# Patient Record
Sex: Female | Born: 1960 | Race: White | Hispanic: No | Marital: Married
Health system: Midwestern US, Academic
[De-identification: ages and names within clinical notes are randomized; demographics above are authoritative.]

---

## 2017-12-22 IMAGING — CR CHEST
2 series · 2 of 2 positions shown · non-contrast
Comparison: none

[chest pa x-wise]
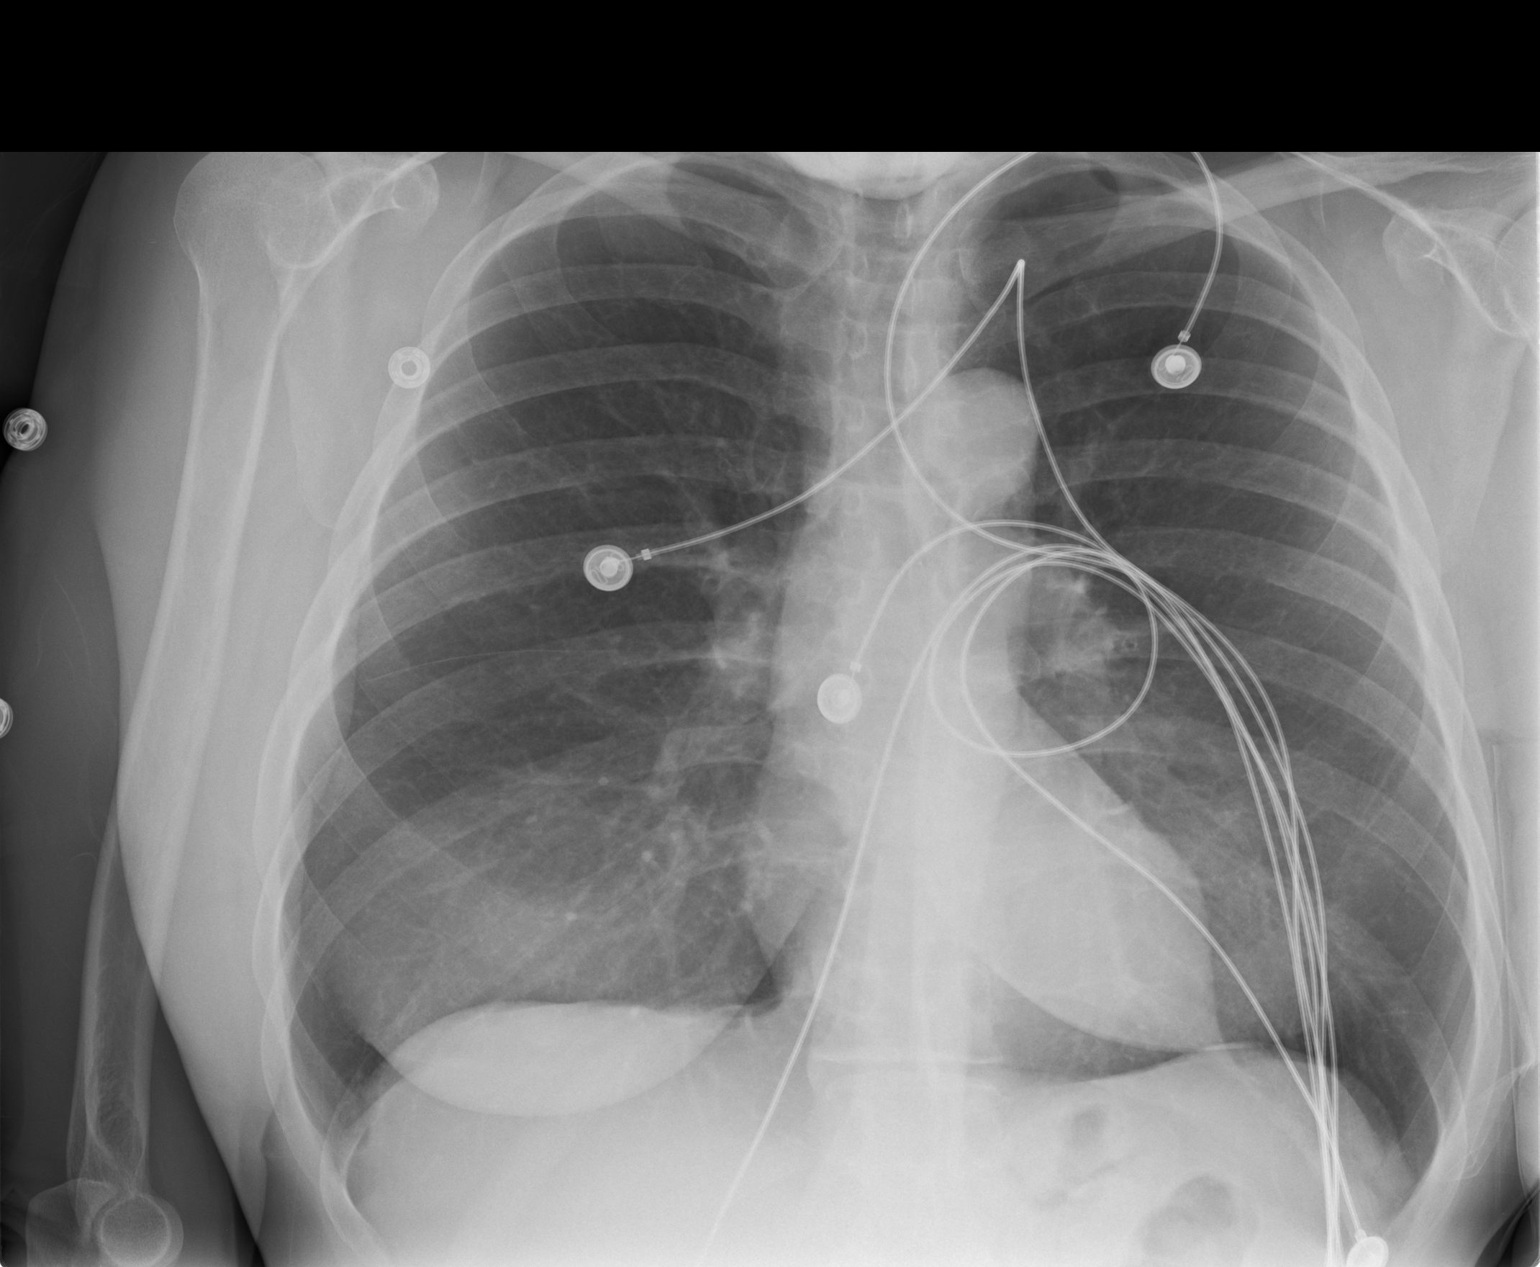

[chest lat]
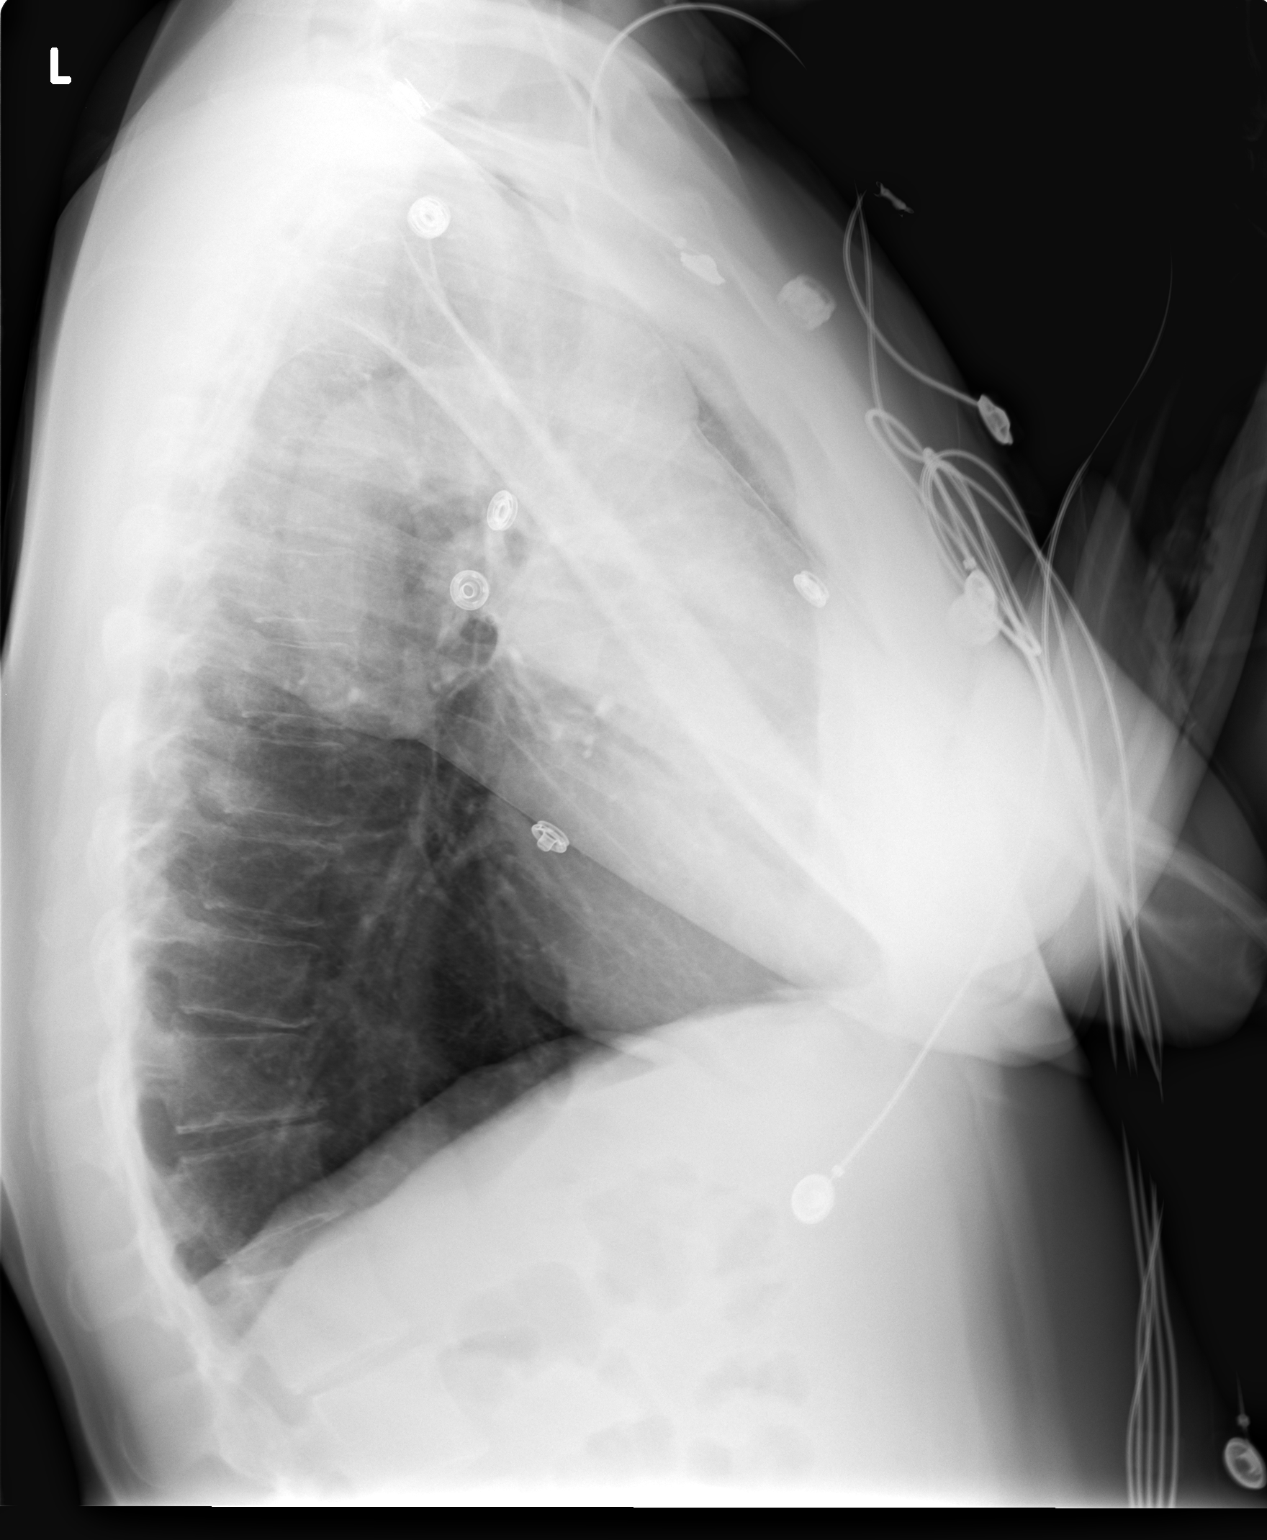

[2 of 2 positions shown; findings below may reference images not displayed]

Diagnostic Study

EXAM

Chest two-view.

INDICATION

Cough and weakness.

FINDINGS

The cardiomediastinal silhouette is within normal limits.

The lungs are hyperinflated.

There is no failure, effusion, or consolidation.

IMPRESSION

No acute lung process is identified.

Tech Notes:

WEAKNESS, ABNORMAL LABS WITH WBC 18.  NO FEVER BUT DRY COUGH.

## 2017-12-23 IMAGING — CR CHEST
2 series · 2 of 2 positions shown · non-contrast
Comparison: none

[chest pa x-wise]
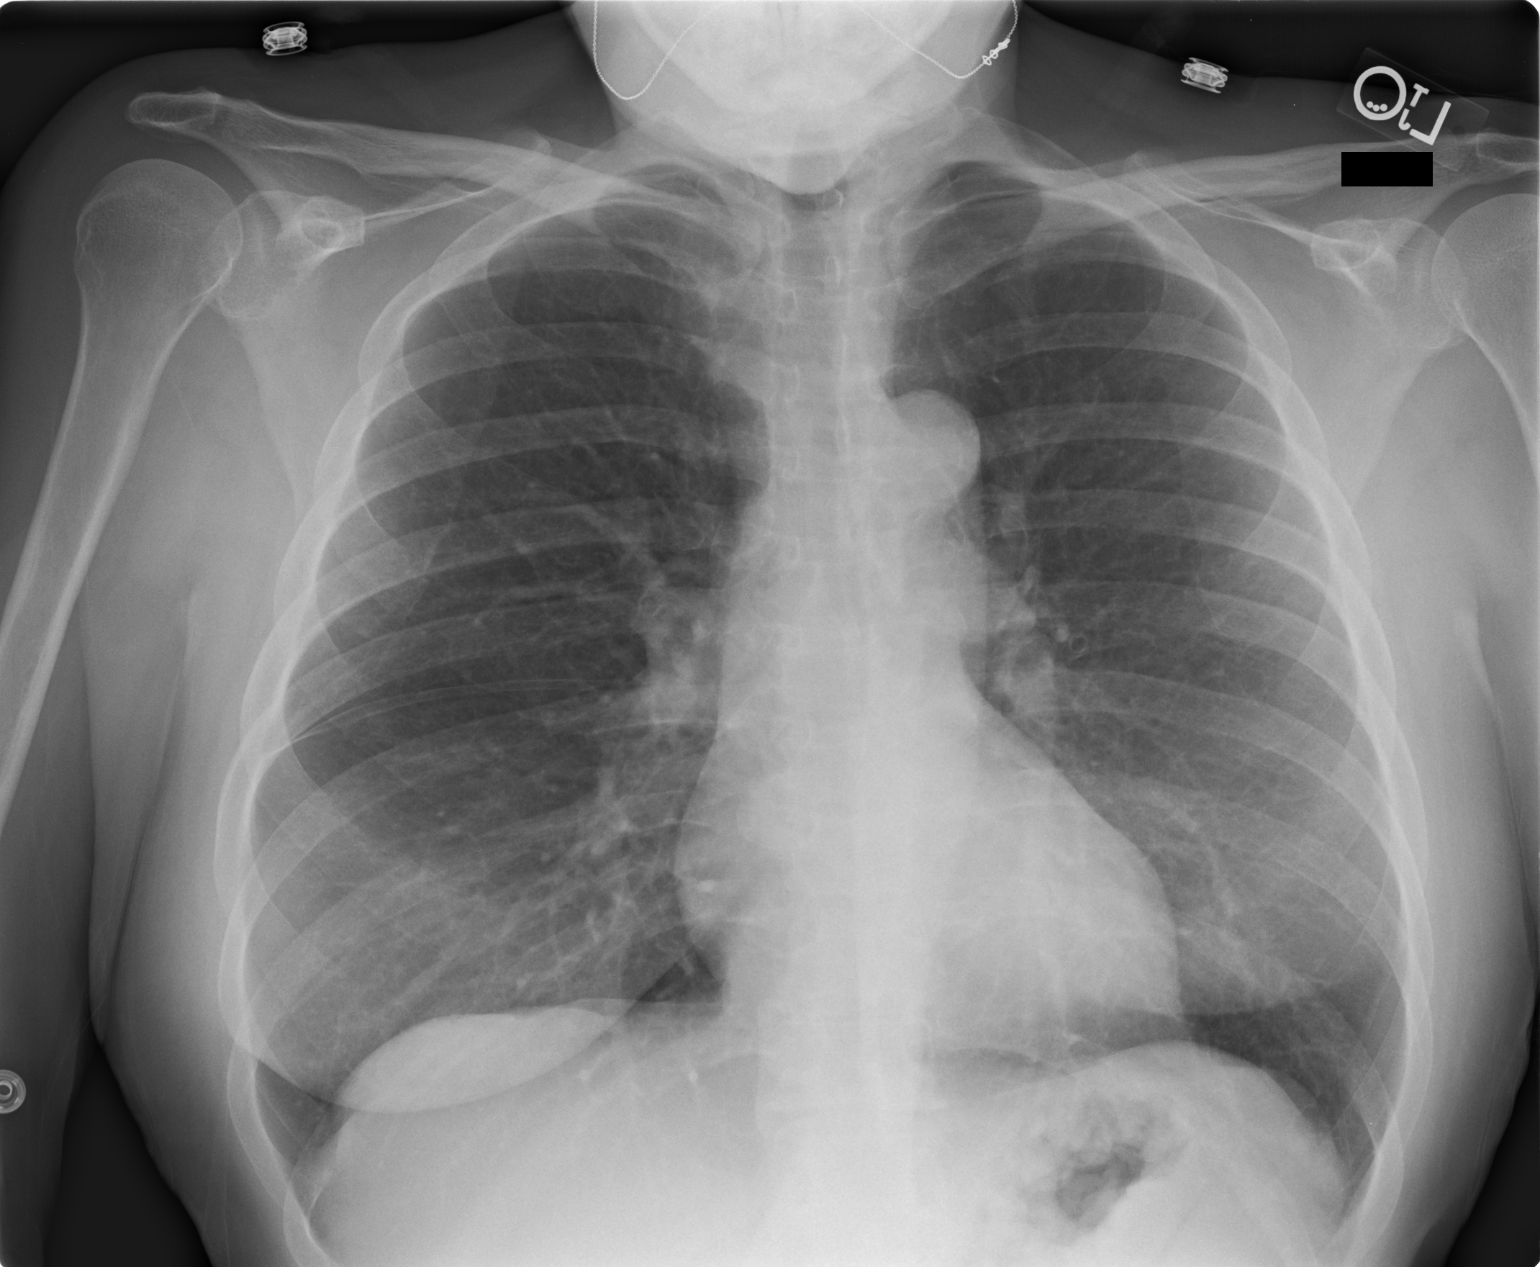

[chest lat]
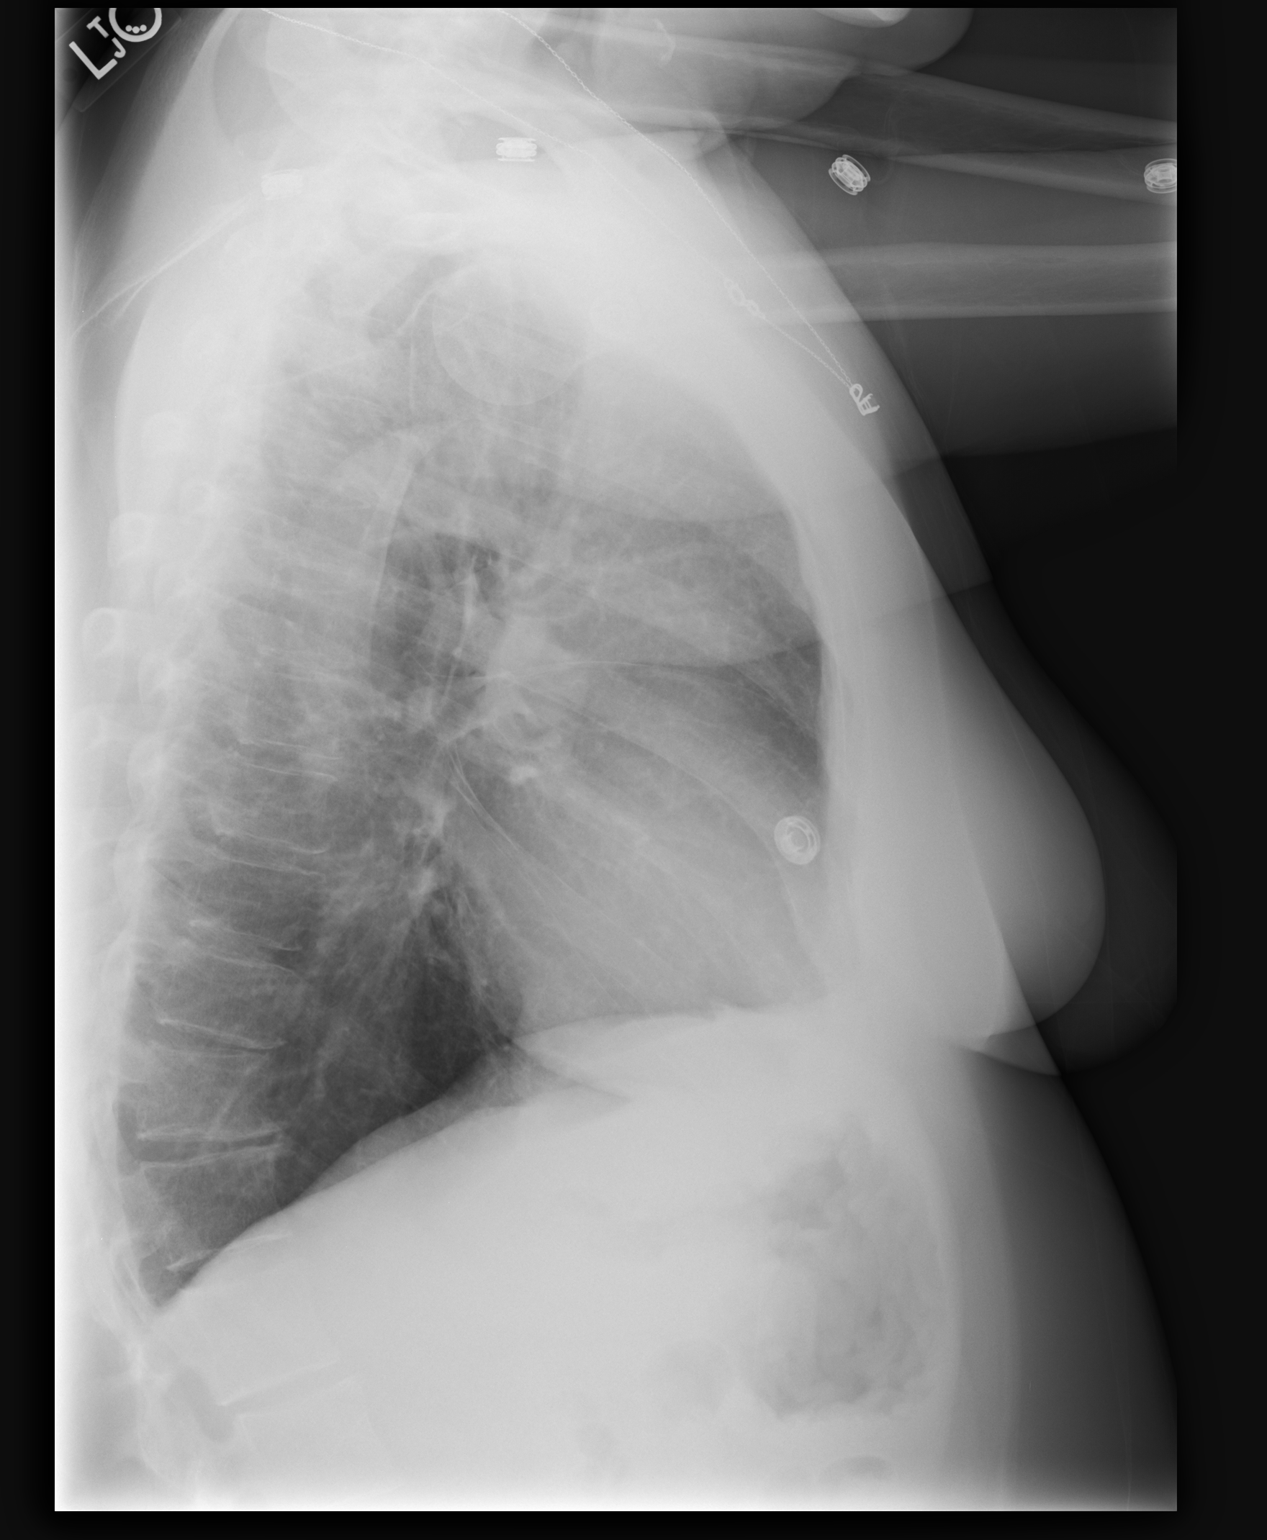

[2 of 2 positions shown; findings below may reference images not displayed]

EXAM
RADIOLOGICAL EXAMINATION, CHEST; 2 VIEWS FRONTAL AND LATERAL CPT 97767

INDICATION
cough leukocytosis
f/u cough. SOA. TJ

TECHNIQUE
2 views of the chest were acquired.

COMPARISONS
Previous examination dated 12/22/2017.

FINDINGS
The cardiac silhouette is within normal limits. The lungs are clear. The pulmonary vasculature is
normal in caliber. The bony structures are adequately mineralized. There is mild thoracic
spondylosis.

IMPRESSION
No acute cardiopulmonary process. Stable appearance of the chest.

Tech Notes:

f/u cough. SOA. TJ

## 2018-01-11 ENCOUNTER — Ambulatory Visit: Admit: 2018-01-11 | Discharge: 2018-01-12 | Payer: Commercial Managed Care - PPO

## 2018-01-11 ENCOUNTER — Encounter: Admit: 2018-01-11 | Discharge: 2018-01-11

## 2018-01-11 DIAGNOSIS — I10 Essential (primary) hypertension: Principal | ICD-10-CM

## 2018-01-11 DIAGNOSIS — R0600 Dyspnea, unspecified: ICD-10-CM

## 2018-01-11 DIAGNOSIS — F329 Major depressive disorder, single episode, unspecified: ICD-10-CM

## 2018-01-11 DIAGNOSIS — R61 Generalized hyperhidrosis: Secondary | ICD-10-CM

## 2018-01-11 DIAGNOSIS — I739 Peripheral vascular disease, unspecified: Principal | ICD-10-CM

## 2018-01-11 DIAGNOSIS — M797 Fibromyalgia: ICD-10-CM

## 2018-01-11 MED ORDER — METOPROLOL TARTRATE 25 MG PO TAB
25 mg | Freq: Two times a day (BID) | ORAL | 0 refills | Status: DC
Start: 2018-01-11 — End: 2018-01-12
  Administered 2018-01-12: 14:00:00 25 mg via ORAL

## 2018-01-11 MED ORDER — ENOXAPARIN 40 MG/0.4 ML SC SYRG
40 mg | Freq: Every day | SUBCUTANEOUS | 0 refills | Status: DC
Start: 2018-01-11 — End: 2018-01-16
  Administered 2018-01-13 – 2018-01-14 (×2): 40 mg via SUBCUTANEOUS

## 2018-01-11 MED ORDER — ACETAMINOPHEN 325 MG PO TAB
650 mg | ORAL | 0 refills | Status: DC | PRN
Start: 2018-01-11 — End: 2018-01-16
  Administered 2018-01-12 – 2018-01-15 (×4): 650 mg via ORAL

## 2018-01-11 MED ORDER — PANTOPRAZOLE 40 MG PO TBEC
40 mg | Freq: Every day | ORAL | 0 refills | Status: DC
Start: 2018-01-11 — End: 2018-01-16
  Administered 2018-01-13 – 2018-01-16 (×4): 40 mg via ORAL

## 2018-01-11 MED ORDER — ONDANSETRON HCL (PF) 4 MG/2 ML IJ SOLN
4 mg | INTRAVENOUS | 0 refills | Status: DC | PRN
Start: 2018-01-11 — End: 2018-01-16

## 2018-01-11 MED ORDER — GABAPENTIN 300 MG PO CAP
600 mg | Freq: Two times a day (BID) | ORAL | 0 refills | Status: DC
Start: 2018-01-11 — End: 2018-01-16
  Administered 2018-01-12 – 2018-01-16 (×10): 600 mg via ORAL

## 2018-01-11 MED ORDER — BUPROPION XL 150 MG PO TB24
300 mg | Freq: Every day | ORAL | 0 refills | Status: DC
Start: 2018-01-11 — End: 2018-01-16
  Administered 2018-01-13 – 2018-01-16 (×4): 300 mg via ORAL

## 2018-01-12 ENCOUNTER — Inpatient Hospital Stay
Admit: 2018-01-12 | Discharge: 2018-01-16 | Disposition: A | Discharge status: Home / Self Care | Payer: MEDICARE | Source: Ambulatory Visit

## 2018-01-12 ENCOUNTER — Inpatient Hospital Stay: Admit: 2018-01-12 | Discharge: 2018-01-12

## 2018-01-12 ENCOUNTER — Encounter: Admit: 2018-01-12 | Discharge: 2018-01-12

## 2018-01-12 DIAGNOSIS — N12 Tubulo-interstitial nephritis, not specified as acute or chronic: ICD-10-CM

## 2018-01-12 DIAGNOSIS — R0600 Dyspnea, unspecified: ICD-10-CM

## 2018-01-12 DIAGNOSIS — I739 Peripheral vascular disease, unspecified: Principal | ICD-10-CM

## 2018-01-12 DIAGNOSIS — F329 Major depressive disorder, single episode, unspecified: ICD-10-CM

## 2018-01-12 DIAGNOSIS — M797 Fibromyalgia: ICD-10-CM

## 2018-01-12 LAB — URINALYSIS DIPSTICK: Lab: NEGATIVE mL/min (ref 1.0–4.8)

## 2018-01-12 LAB — PHOSPHORUS: Lab: 3.5 mg/dL (ref 2.0–4.5)

## 2018-01-12 LAB — COMPREHENSIVE METABOLIC PANEL
Lab: 138 MMOL/L — ABNORMAL LOW (ref 137–147)
Lab: 126 mg/dL — ABNORMAL HIGH (ref >60)
Lab: 142 MMOL/L — ABNORMAL HIGH (ref 137–147)
Lab: 4.2 MMOL/L — ABNORMAL LOW (ref 3.5–5.1)

## 2018-01-12 LAB — C REACTIVE PROTEIN (CRP): Lab: 14 mg/dL — ABNORMAL HIGH (ref <1.0)

## 2018-01-12 LAB — TSH WITH FREE T4 REFLEX: Lab: 1.5 uU/mL (ref 0.35–5.00)

## 2018-01-12 LAB — LACTIC ACID(LACTATE): Lab: 0.6 MMOL/L — ABNORMAL HIGH (ref 0.5–2.0)

## 2018-01-12 LAB — SED RATE: Lab: 81 mm/h — ABNORMAL HIGH (ref 0–30)

## 2018-01-12 LAB — CBC AND DIFF
Lab: 12 10*3/uL — ABNORMAL HIGH (ref 4.5–11.0)
Lab: 6.5 K/UL — ABNORMAL LOW (ref >60)

## 2018-01-12 LAB — MAGNESIUM: Lab: 2 mg/dL (ref 1.6–2.6)

## 2018-01-12 LAB — URINALYSIS, MICROSCOPIC

## 2018-01-12 MED ORDER — MELATONIN 3 MG PO TAB
3 mg | Freq: Every evening | ORAL | 0 refills | Status: DC
Start: 2018-01-12 — End: 2018-01-16
  Administered 2018-01-12 – 2018-01-16 (×5): 3 mg via ORAL

## 2018-01-12 MED ORDER — ASCORBIC ACID (VITAMIN C) 500 MG PO TAB
250 mg | Freq: Every day | ORAL | 0 refills | Status: DC
Start: 2018-01-12 — End: 2018-01-16
  Administered 2018-01-13 – 2018-01-16 (×4): 250 mg via ORAL

## 2018-01-12 MED ORDER — METOPROLOL TARTRATE 25 MG PO TAB
25 mg | Freq: Two times a day (BID) | ORAL | 0 refills | Status: CP
Start: 2018-01-12 — End: ?
  Administered 2018-01-13: 02:00:00 25 mg via ORAL

## 2018-01-12 MED ORDER — METOPROLOL SUCCINATE 25 MG PO TB24
25 mg | Freq: Every morning | ORAL | 0 refills | Status: DC
Start: 2018-01-12 — End: 2018-01-12

## 2018-01-12 MED ORDER — CYANOCOBALAMIN (VITAMIN B-12) 100 MCG PO TAB
100 ug | Freq: Every day | ORAL | 0 refills | Status: DC
Start: 2018-01-12 — End: 2018-01-16
  Administered 2018-01-13 – 2018-01-16 (×4): 100 ug via ORAL

## 2018-01-12 MED ORDER — METOPROLOL SUCCINATE 25 MG PO TB24
25 mg | Freq: Every morning | ORAL | 0 refills | Status: DC
Start: 2018-01-12 — End: 2018-01-16
  Administered 2018-01-13 – 2018-01-16 (×4): 25 mg via ORAL

## 2018-01-12 MED ORDER — PERFLUTREN LIPID MICROSPHERES 1.1 MG/ML IV SUSP
1-20 mL | Freq: Once | INTRAVENOUS | 0 refills | Status: CP | PRN
Start: 2018-01-12 — End: ?
  Administered 2018-01-12: 22:00:00 2.5 mL via INTRAVENOUS

## 2018-01-12 MED ORDER — TRAMADOL 50 MG PO TAB
50 mg | ORAL | 0 refills | Status: DC | PRN
Start: 2018-01-12 — End: 2018-01-16
  Administered 2018-01-12 – 2018-01-14 (×4): 50 mg via ORAL

## 2018-01-12 MED ORDER — LORATADINE 10 MG PO TAB
10 mg | Freq: Every day | ORAL | 0 refills | Status: DC | PRN
Start: 2018-01-12 — End: 2018-01-16

## 2018-01-13 ENCOUNTER — Encounter: Admit: 2018-01-13 | Discharge: 2018-01-13

## 2018-01-13 ENCOUNTER — Inpatient Hospital Stay: Admit: 2018-01-13 | Discharge: 2018-01-13

## 2018-01-13 DIAGNOSIS — N12 Tubulo-interstitial nephritis, not specified as acute or chronic: ICD-10-CM

## 2018-01-13 LAB — CBC AND DIFF: Lab: 9.8 K/UL — ABNORMAL LOW (ref 4.5–11.0)

## 2018-01-13 LAB — COMPREHENSIVE METABOLIC PANEL: Lab: 134 MMOL/L — ABNORMAL LOW (ref 137–147)

## 2018-01-13 MED ORDER — SODIUM CHLORIDE 0.9 % IJ SOLN
50 mL | Freq: Once | INTRAVENOUS | 0 refills | Status: CP
Start: 2018-01-13 — End: ?
  Administered 2018-01-14: 02:00:00 50 mL via INTRAVENOUS

## 2018-01-13 MED ORDER — IOHEXOL 350 MG IODINE/ML IV SOLN
85 mL | Freq: Once | INTRAVENOUS | 0 refills | Status: CP
Start: 2018-01-13 — End: ?
  Administered 2018-01-14: 02:00:00 85 mL via INTRAVENOUS

## 2018-01-13 MED ORDER — LORAZEPAM 0.5 MG PO TAB
.5 mg | Freq: Once | ORAL | 0 refills | Status: AC
Start: 2018-01-13 — End: ?

## 2018-01-13 MED ORDER — ALBUTEROL SULFATE 2.5 MG /3 ML (0.083 %) IN NEBU
2.5 mg | RESPIRATORY_TRACT | 0 refills | Status: DC | PRN
Start: 2018-01-13 — End: 2018-01-16

## 2018-01-13 MED ADMIN — ALBUTEROL SULFATE 2.5 MG /3 ML (0.083 %) IN NEBU [250]: 2.5 mg | RESPIRATORY_TRACT | @ 14:00:00 | Stop: 2018-01-13 | NDC 00487950101

## 2018-01-14 LAB — CULTURE-URINE W/SENSITIVITY
Lab: >10
Lab: >10 — AB

## 2018-01-14 LAB — COMPREHENSIVE METABOLIC PANEL: Lab: 136 MMOL/L — ABNORMAL LOW (ref 137–147)

## 2018-01-14 LAB — CBC AND DIFF: Lab: 7.7 K/UL — ABNORMAL LOW (ref 4.5–11.0)

## 2018-01-14 MED ORDER — LEVOFLOXACIN IN D5W 750 MG/150 ML IV PGBK
750 mg | INTRAVENOUS | 0 refills | Status: DC
Start: 2018-01-14 — End: 2018-01-16
  Administered 2018-01-14 – 2018-01-15 (×2): 750 mg via INTRAVENOUS

## 2018-01-15 LAB — COMPREHENSIVE METABOLIC PANEL: Lab: 138 MMOL/L — ABNORMAL LOW (ref >60)

## 2018-01-15 LAB — CBC AND DIFF: Lab: 4.8 K/UL — ABNORMAL LOW (ref 4.5–11.0)

## 2018-01-15 MED ORDER — POLYETHYLENE GLYCOL 3350 17 GRAM PO PWPK
1 | Freq: Every day | ORAL | 0 refills | Status: DC
Start: 2018-01-15 — End: 2018-01-16
  Administered 2018-01-15 – 2018-01-16 (×2): 17 g via ORAL

## 2018-01-15 MED ORDER — LACTOBACILLUS RHAMNOSUS GG 15 BILLION CELL PO CPSP
1 | Freq: Two times a day (BID) | ORAL | 0 refills | Status: DC
Start: 2018-01-15 — End: 2018-01-16
  Administered 2018-01-15 – 2018-01-16 (×2): 1 via ORAL

## 2018-01-16 ENCOUNTER — Encounter: Admit: 2018-01-16 | Discharge: 2018-01-16

## 2018-01-16 ENCOUNTER — Inpatient Hospital Stay: Admit: 2018-01-11 | Discharge: 2018-01-11

## 2018-01-16 DIAGNOSIS — K219 Gastro-esophageal reflux disease without esophagitis: ICD-10-CM

## 2018-01-16 DIAGNOSIS — B9620 Unspecified Escherichia coli [E. coli] as the cause of diseases classified elsewhere: ICD-10-CM

## 2018-01-16 DIAGNOSIS — J449 Chronic obstructive pulmonary disease, unspecified: ICD-10-CM

## 2018-01-16 DIAGNOSIS — I10 Essential (primary) hypertension: ICD-10-CM

## 2018-01-16 DIAGNOSIS — M797 Fibromyalgia: ICD-10-CM

## 2018-01-16 DIAGNOSIS — R61 Generalized hyperhidrosis: ICD-10-CM

## 2018-01-16 DIAGNOSIS — F329 Major depressive disorder, single episode, unspecified: ICD-10-CM

## 2018-01-16 DIAGNOSIS — N10 Acute tubulo-interstitial nephritis: Principal | ICD-10-CM

## 2018-01-16 MED ORDER — LEVOFLOXACIN 750 MG PO TAB
750 mg | ORAL_TABLET | Freq: Every day | ORAL | 0 refills | 7.00000 days | Status: AC
Start: 2018-01-16 — End: 2018-01-16
  Filled 2018-01-16 (×2): qty 5, 5d supply, fill #1

## 2018-01-16 MED ORDER — LEVOFLOXACIN 750 MG PO TAB
750 mg | ORAL_TABLET | Freq: Every day | ORAL | 0 refills | 7.00000 days | Status: AC
Start: 2018-01-16 — End: 2018-02-05

## 2018-01-16 MED ORDER — POLYETHYLENE GLYCOL 3350 17 GRAM PO PWPK
17 g | Freq: Two times a day (BID) | ORAL | 0 refills | 18.00000 days | Status: AC
Start: 2018-01-16 — End: ?

## 2018-01-16 MED ORDER — SENNOSIDES-DOCUSATE SODIUM 8.6-50 MG PO TAB
1 | Freq: Two times a day (BID) | ORAL | 0 refills | Status: AC | PRN
Start: 2018-01-16 — End: ?

## 2018-01-16 MED ORDER — LACTOBACILLUS RHAMNOSUS GG 15 BILLION CELL PO CPSP
1 | ORAL_CAPSULE | Freq: Two times a day (BID) | ORAL | 3 refills | Status: AC
Start: 2018-01-16 — End: ?

## 2018-01-18 LAB — CULTURE-BLOOD W/SENSITIVITY

## 2018-01-31 ENCOUNTER — Encounter: Admit: 2018-01-31 | Discharge: 2018-01-31

## 2018-02-02 ENCOUNTER — Encounter: Admit: 2018-02-02 | Discharge: 2018-02-02

## 2018-02-02 ENCOUNTER — Inpatient Hospital Stay
Admit: 2018-02-02 | Discharge: 2018-02-05 | Disposition: A | Discharge status: Home / Self Care | Payer: MEDICARE | Source: Other Acute Inpatient Hospital

## 2018-02-02 LAB — URINALYSIS, MICROSCOPIC

## 2018-02-02 LAB — CBC AND DIFF: Lab: 15 10*3/uL — ABNORMAL HIGH (ref 4.5–11.0)

## 2018-02-02 LAB — URINALYSIS DIPSTICK
Lab: NEGATIVE 10*3/uL (ref 0–0.45)
Lab: NEGATIVE mL/min (ref 0–0.80)
Lab: NEGATIVE mL/min (ref 1.0–4.8)

## 2018-02-02 MED ORDER — CYANOCOBALAMIN (VITAMIN B-12) 100 MCG PO TAB
100 ug | Freq: Every day | ORAL | 0 refills | Status: DC
Start: 2018-02-02 — End: 2018-02-05
  Administered 2018-02-03 – 2018-02-05 (×3): 100 ug via ORAL

## 2018-02-02 MED ORDER — GABAPENTIN 300 MG PO CAP
600 mg | Freq: Two times a day (BID) | ORAL | 0 refills | Status: DC
Start: 2018-02-02 — End: 2018-02-05
  Administered 2018-02-03 – 2018-02-05 (×6): 600 mg via ORAL

## 2018-02-02 MED ORDER — LACTOBACILLUS RHAMNOSUS GG 15 BILLION CELL PO CPSP
1 | Freq: Two times a day (BID) | ORAL | 0 refills | Status: DC
Start: 2018-02-02 — End: 2018-02-05
  Administered 2018-02-02 – 2018-02-05 (×7): 1 via ORAL

## 2018-02-02 MED ORDER — POLYETHYLENE GLYCOL 3350 17 GRAM PO PWPK
17 g | Freq: Two times a day (BID) | ORAL | 0 refills | Status: DC
Start: 2018-02-02 — End: 2018-02-05
  Administered 2018-02-03 – 2018-02-05 (×5): 17 g via ORAL

## 2018-02-02 MED ORDER — DICLOFENAC SODIUM 1 % TP GEL
4 g | TOPICAL | 0 refills | Status: DC | PRN
Start: 2018-02-02 — End: 2018-02-05

## 2018-02-02 MED ORDER — TRAMADOL 50 MG PO TAB
50 mg | ORAL | 0 refills | Status: DC | PRN
Start: 2018-02-02 — End: 2018-02-05
  Administered 2018-02-02 – 2018-02-03 (×3): 50 mg via ORAL

## 2018-02-02 MED ORDER — ASCORBIC ACID (VITAMIN C) 500 MG PO TAB
250 mg | Freq: Every day | ORAL | 0 refills | Status: DC
Start: 2018-02-02 — End: 2018-02-05
  Administered 2018-02-03 – 2018-02-05 (×3): 250 mg via ORAL

## 2018-02-02 MED ORDER — SENNOSIDES-DOCUSATE SODIUM 8.6-50 MG PO TAB
1 | Freq: Two times a day (BID) | ORAL | 0 refills | Status: DC | PRN
Start: 2018-02-02 — End: 2018-02-05

## 2018-02-02 MED ORDER — BUPROPION XL 300 MG PO TB24
300 mg | Freq: Every morning | ORAL | 0 refills | Status: DC
Start: 2018-02-02 — End: 2018-02-05
  Administered 2018-02-03 – 2018-02-05 (×3): 300 mg via ORAL

## 2018-02-02 MED ORDER — CEFTRIAXONE INJ 1GM IVP
1 g | INTRAVENOUS | 0 refills | Status: DC
Start: 2018-02-02 — End: 2018-02-03
  Administered 2018-02-02: 1 g via INTRAVENOUS

## 2018-02-02 MED ORDER — ENOXAPARIN 40 MG/0.4 ML SC SYRG
40 mg | Freq: Every day | SUBCUTANEOUS | 0 refills | Status: DC
Start: 2018-02-02 — End: 2018-02-05
  Administered 2018-02-03 – 2018-02-04 (×2): 40 mg via SUBCUTANEOUS

## 2018-02-02 MED ORDER — PANTOPRAZOLE 20 MG PO TBEC
20 mg | Freq: Every day | ORAL | 0 refills | Status: DC
Start: 2018-02-02 — End: 2018-02-05
  Administered 2018-02-03 – 2018-02-05 (×3): 20 mg via ORAL

## 2018-02-02 MED ORDER — METOPROLOL SUCCINATE 25 MG PO TB24
25 mg | Freq: Every morning | ORAL | 0 refills | Status: DC
Start: 2018-02-02 — End: 2018-02-05
  Administered 2018-02-03 – 2018-02-05 (×3): 25 mg via ORAL

## 2018-02-02 MED ORDER — ACETAMINOPHEN 325 MG PO TAB
650 mg | Freq: Once | ORAL | 0 refills | Status: CP
Start: 2018-02-02 — End: ?
  Administered 2018-02-03: 02:00:00 650 mg via ORAL

## 2018-02-03 ENCOUNTER — Encounter: Admit: 2018-02-03 | Discharge: 2018-02-03

## 2018-02-03 ENCOUNTER — Inpatient Hospital Stay: Admit: 2018-02-03 | Discharge: 2018-02-03

## 2018-02-03 LAB — COMPREHENSIVE METABOLIC PANEL
Lab: 133 MMOL/L — ABNORMAL LOW (ref 137–147)
Lab: 139 MMOL/L — ABNORMAL LOW (ref >60)
Lab: 4 MMOL/L (ref >60)

## 2018-02-03 LAB — CBC AND DIFF
Lab: 3.6 M/UL — ABNORMAL LOW (ref 4.0–5.0)
Lab: 9 K/UL — ABNORMAL LOW (ref 4.5–11.0)

## 2018-02-03 LAB — MAGNESIUM
Lab: 1.9 mg/dL — ABNORMAL LOW (ref 1.6–2.6)
Lab: 2.1 mg/dL (ref 1.6–2.6)

## 2018-02-03 MED ORDER — IOHEXOL 350 MG IODINE/ML IV SOLN
80 mL | Freq: Once | INTRAVENOUS | 0 refills | Status: CP
Start: 2018-02-03 — End: ?
  Administered 2018-02-03: 16:00:00 80 mL via INTRAVENOUS

## 2018-02-03 MED ORDER — SODIUM CHLORIDE 0.9 % IJ SOLN
50 mL | Freq: Once | INTRAVENOUS | 0 refills | Status: CP
Start: 2018-02-03 — End: ?
  Administered 2018-02-03: 16:00:00 50 mL via INTRAVENOUS

## 2018-02-03 MED ORDER — HYDROCORTISONE 1 % TP CREA
Freq: Two times a day (BID) | TOPICAL | 0 refills | Status: DC
Start: 2018-02-03 — End: 2018-02-05
  Administered 2018-02-04 – 2018-02-05 (×2): via TOPICAL

## 2018-02-03 MED ORDER — CEFTRIAXONE INJ 1GM IVP
1 g | INTRAVENOUS | 0 refills | Status: DC
Start: 2018-02-03 — End: 2018-02-04
  Administered 2018-02-03: 23:00:00 1 g via INTRAVENOUS

## 2018-02-03 MED ORDER — DIPHENHYDRAMINE HCL 12.5 MG/5 ML PO ELIX
25 mg | Freq: Once | ORAL | 0 refills | Status: CP
Start: 2018-02-03 — End: ?
  Administered 2018-02-04: 04:00:00 25 mg via ORAL

## 2018-02-03 MED ADMIN — SODIUM CHLORIDE 0.9 % IJ SOLN [7319]: 10 mL | INTRAVENOUS | @ 23:00:00 | Stop: 2018-02-03 | NDC 00409488803

## 2018-02-04 LAB — COMPREHENSIVE METABOLIC PANEL
Lab: 138 MMOL/L (ref 137–147)
Lab: 3.9 MMOL/L — ABNORMAL LOW (ref >60)

## 2018-02-04 LAB — MAGNESIUM: Lab: 2.1 mg/dL — ABNORMAL HIGH (ref 1.6–2.6)

## 2018-02-04 LAB — CULTURE-URINE W/SENSITIVITY

## 2018-02-04 LAB — CBC AND DIFF
Lab: 3.9 M/UL — ABNORMAL LOW (ref 4.0–5.0)
Lab: 6.8 K/UL — ABNORMAL LOW (ref 4.5–11.0)

## 2018-02-04 MED ORDER — LEVOFLOXACIN IN D5W 750 MG/150 ML IV PGBK
750 mg | INTRAVENOUS | 0 refills | Status: DC
Start: 2018-02-04 — End: 2018-02-05
  Administered 2018-02-04: 16:00:00 750 mg via INTRAVENOUS

## 2018-02-04 MED ORDER — DIPHENHYDRAMINE HCL 25 MG PO CAP
25 mg | Freq: Two times a day (BID) | ORAL | 0 refills | Status: DC | PRN
Start: 2018-02-04 — End: 2018-02-05
  Administered 2018-02-04 – 2018-02-05 (×2): 25 mg via ORAL

## 2018-02-04 MED ORDER — LEVOFLOXACIN IN D5W 500 MG/100 ML IV PGBK
500 mg | INTRAVENOUS | 0 refills | Status: DC
Start: 2018-02-04 — End: 2018-02-04

## 2018-02-05 ENCOUNTER — Encounter: Admit: 2018-02-05 | Discharge: 2018-02-05

## 2018-02-05 DIAGNOSIS — R21 Rash and other nonspecific skin eruption: ICD-10-CM

## 2018-02-05 DIAGNOSIS — N12 Tubulo-interstitial nephritis, not specified as acute or chronic: ICD-10-CM

## 2018-02-05 DIAGNOSIS — T361X5A Adverse effect of cephalosporins and other beta-lactam antibiotics, initial encounter: ICD-10-CM

## 2018-02-05 DIAGNOSIS — I739 Peripheral vascular disease, unspecified: ICD-10-CM

## 2018-02-05 DIAGNOSIS — Z87891 Personal history of nicotine dependence: ICD-10-CM

## 2018-02-05 DIAGNOSIS — N3000 Acute cystitis without hematuria: Principal | ICD-10-CM

## 2018-02-05 DIAGNOSIS — I10 Essential (primary) hypertension: ICD-10-CM

## 2018-02-05 DIAGNOSIS — F329 Major depressive disorder, single episode, unspecified: ICD-10-CM

## 2018-02-05 DIAGNOSIS — M797 Fibromyalgia: ICD-10-CM

## 2018-02-05 LAB — MAGNESIUM: Lab: 2.2 mg/dL — ABNORMAL HIGH (ref 1.6–2.6)

## 2018-02-05 LAB — CBC AND DIFF: Lab: 7.5 K/UL — ABNORMAL LOW (ref 4.5–11.0)

## 2018-02-05 LAB — COMPREHENSIVE METABOLIC PANEL: Lab: 138 MMOL/L — ABNORMAL LOW (ref >60)

## 2018-02-05 MED ORDER — LEVOFLOXACIN 750 MG PO TAB
750 mg | ORAL_TABLET | ORAL | 0 refills | 7.00000 days | Status: AC
Start: 2018-02-05 — End: ?
  Filled 2018-02-05 (×2): qty 9, 9d supply, fill #1

## 2018-02-05 MED ORDER — LEVOFLOXACIN 750 MG PO TAB
750 mg | ORAL | 0 refills | Status: DC
Start: 2018-02-05 — End: 2018-02-05
  Administered 2018-02-05: 16:00:00 750 mg via ORAL

## 2018-02-05 MED ORDER — LEVOFLOXACIN 750 MG PO TAB
750 mg | ORAL_TABLET | ORAL | 0 refills | Status: CN
Start: 2018-02-05 — End: ?

## 2018-02-08 LAB — CULTURE-BLOOD W/SENSITIVITY

## 2019-04-12 IMAGING — CR CHEST
2 series · 2 of 2 positions shown · non-contrast
Comparison: none

[chest pa]
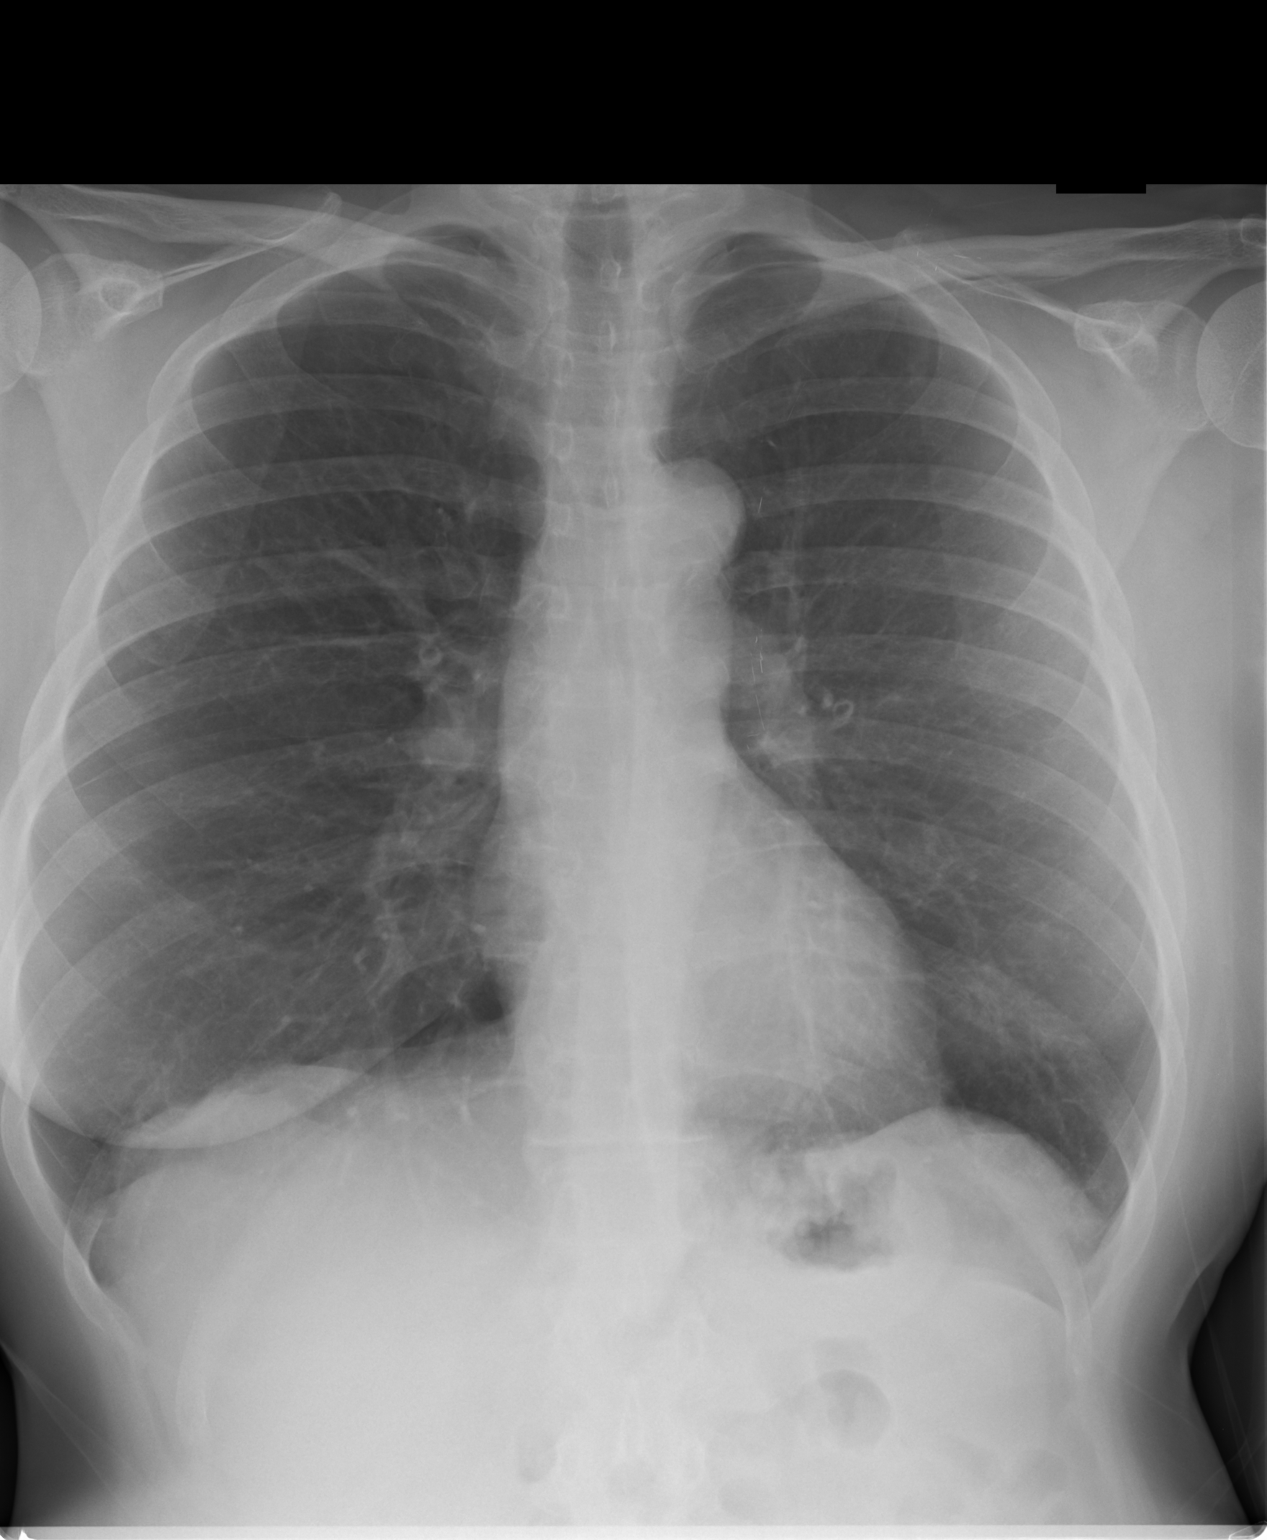

[chest lat]
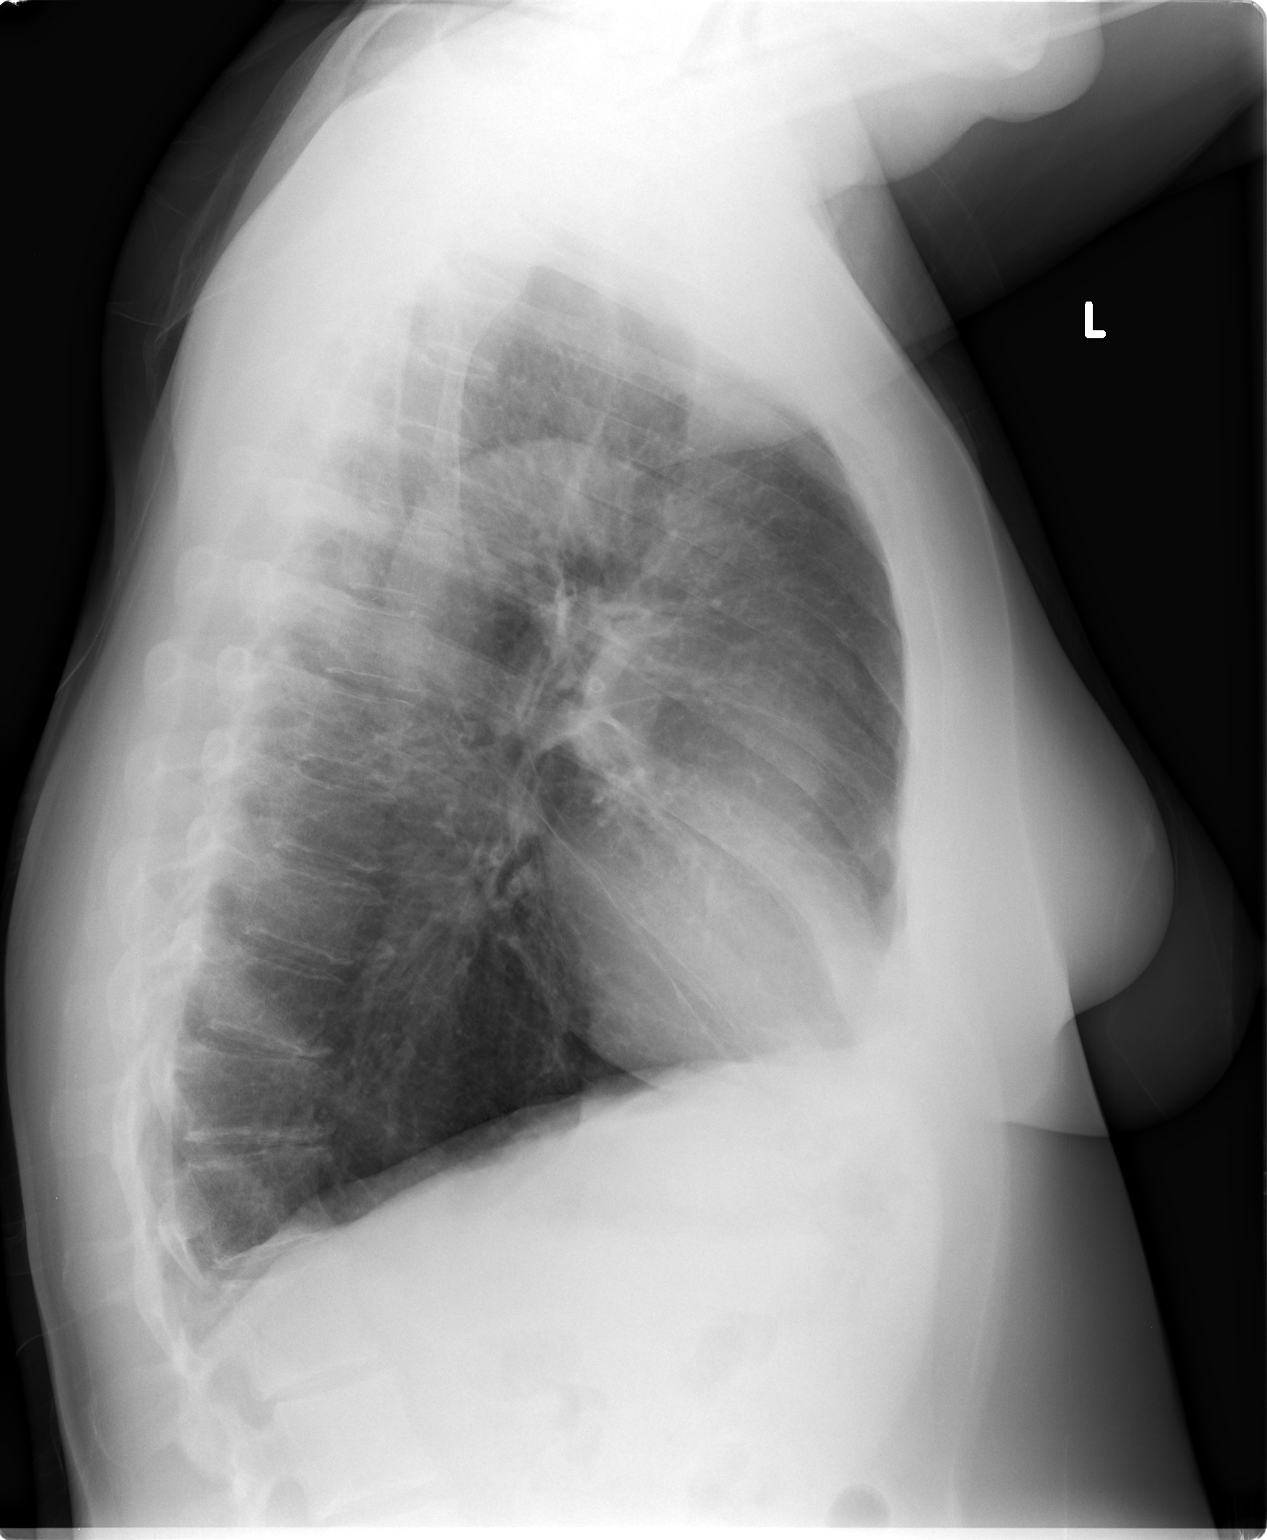

[2 of 2 positions shown; findings below may reference images not displayed]

EXAM

XR chest 2V

INDICATION

COPD with dyspnea and SOB
cough, soa and sweating. h/o copd. me/tm

TECHNIQUE

XR chest 2V

COMPARISONS

December 23, 2017

FINDINGS

High lung volumes. No focal consolidation, pleural effusion, or pneumothorax. The heart size is
normal. Pulmonary vasculature is unremarkable. No acute osseous findings. Degenerative changes of
the spine.

IMPRESSION

No acute cardiopulmonary findings.

Tech Notes:

cough, soa and sweating. h/o copd. me/tm

## 2019-04-23 IMAGING — CR CHEST
2 series · 2 of 2 positions shown · non-contrast
Comparison: none

[chest pa]
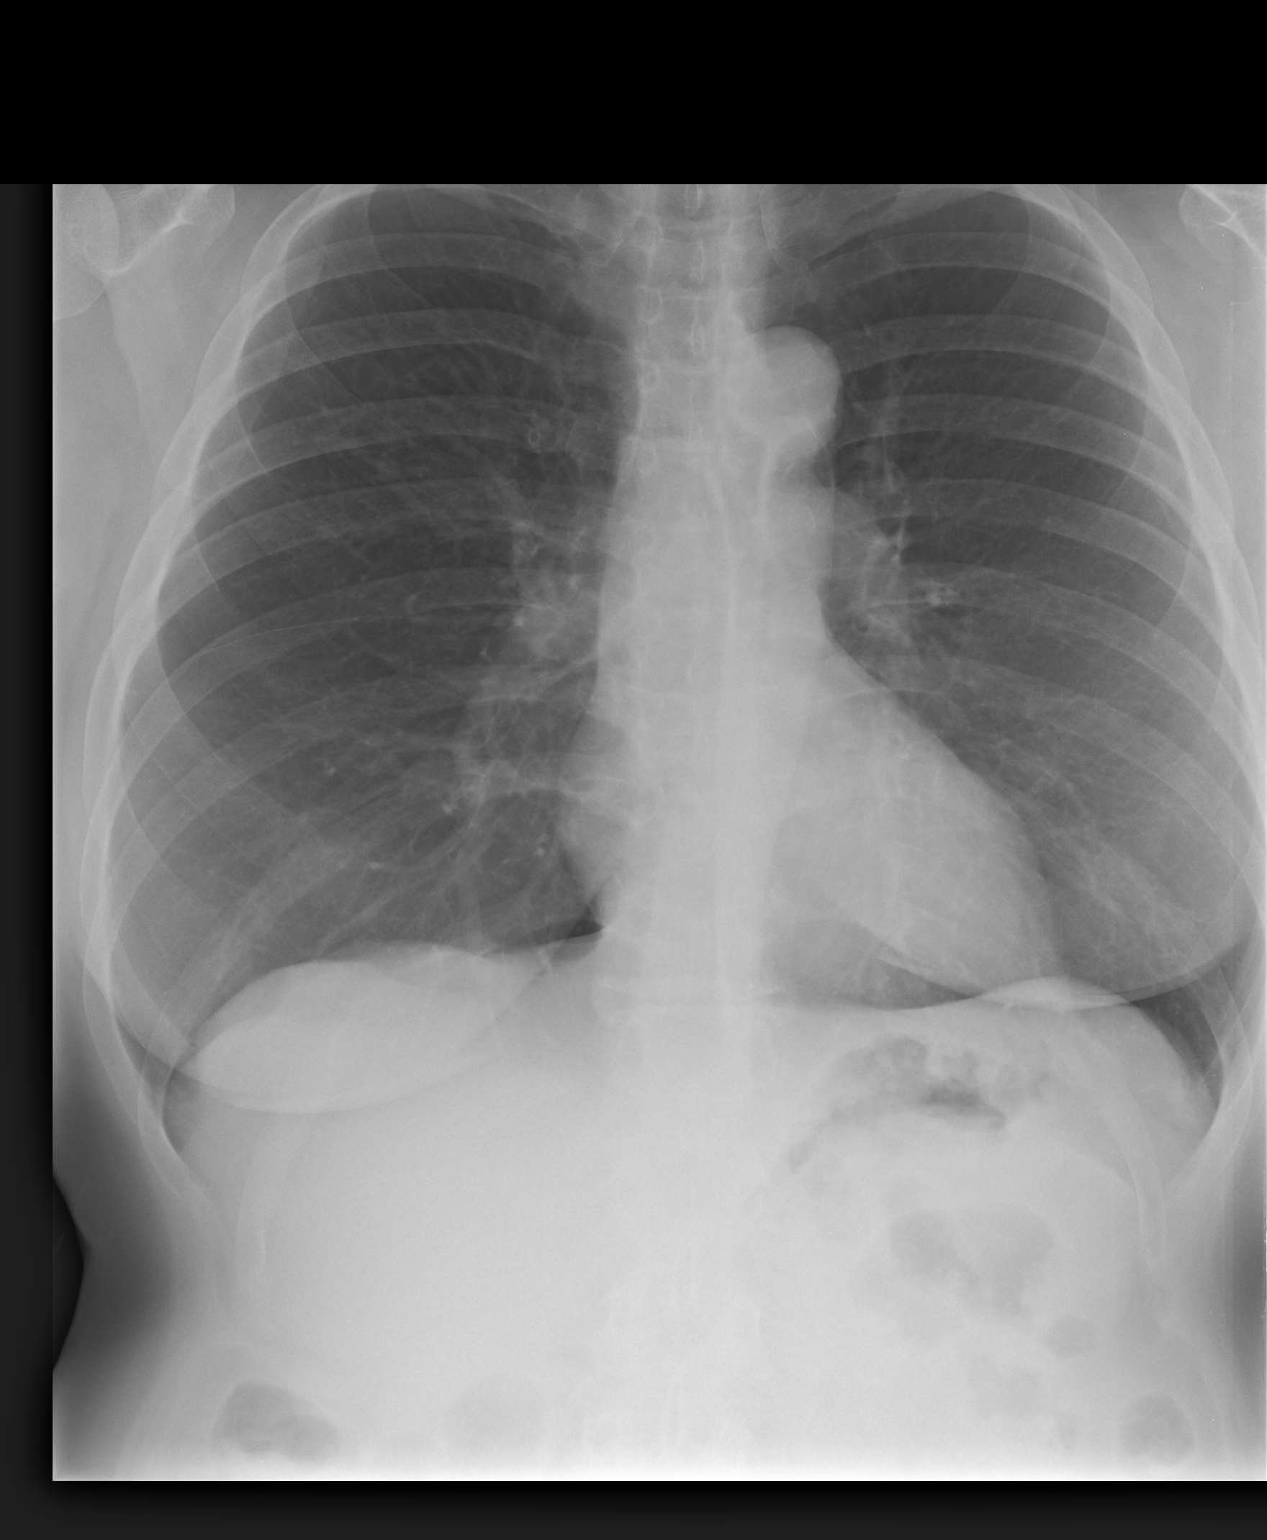

[chest lat]
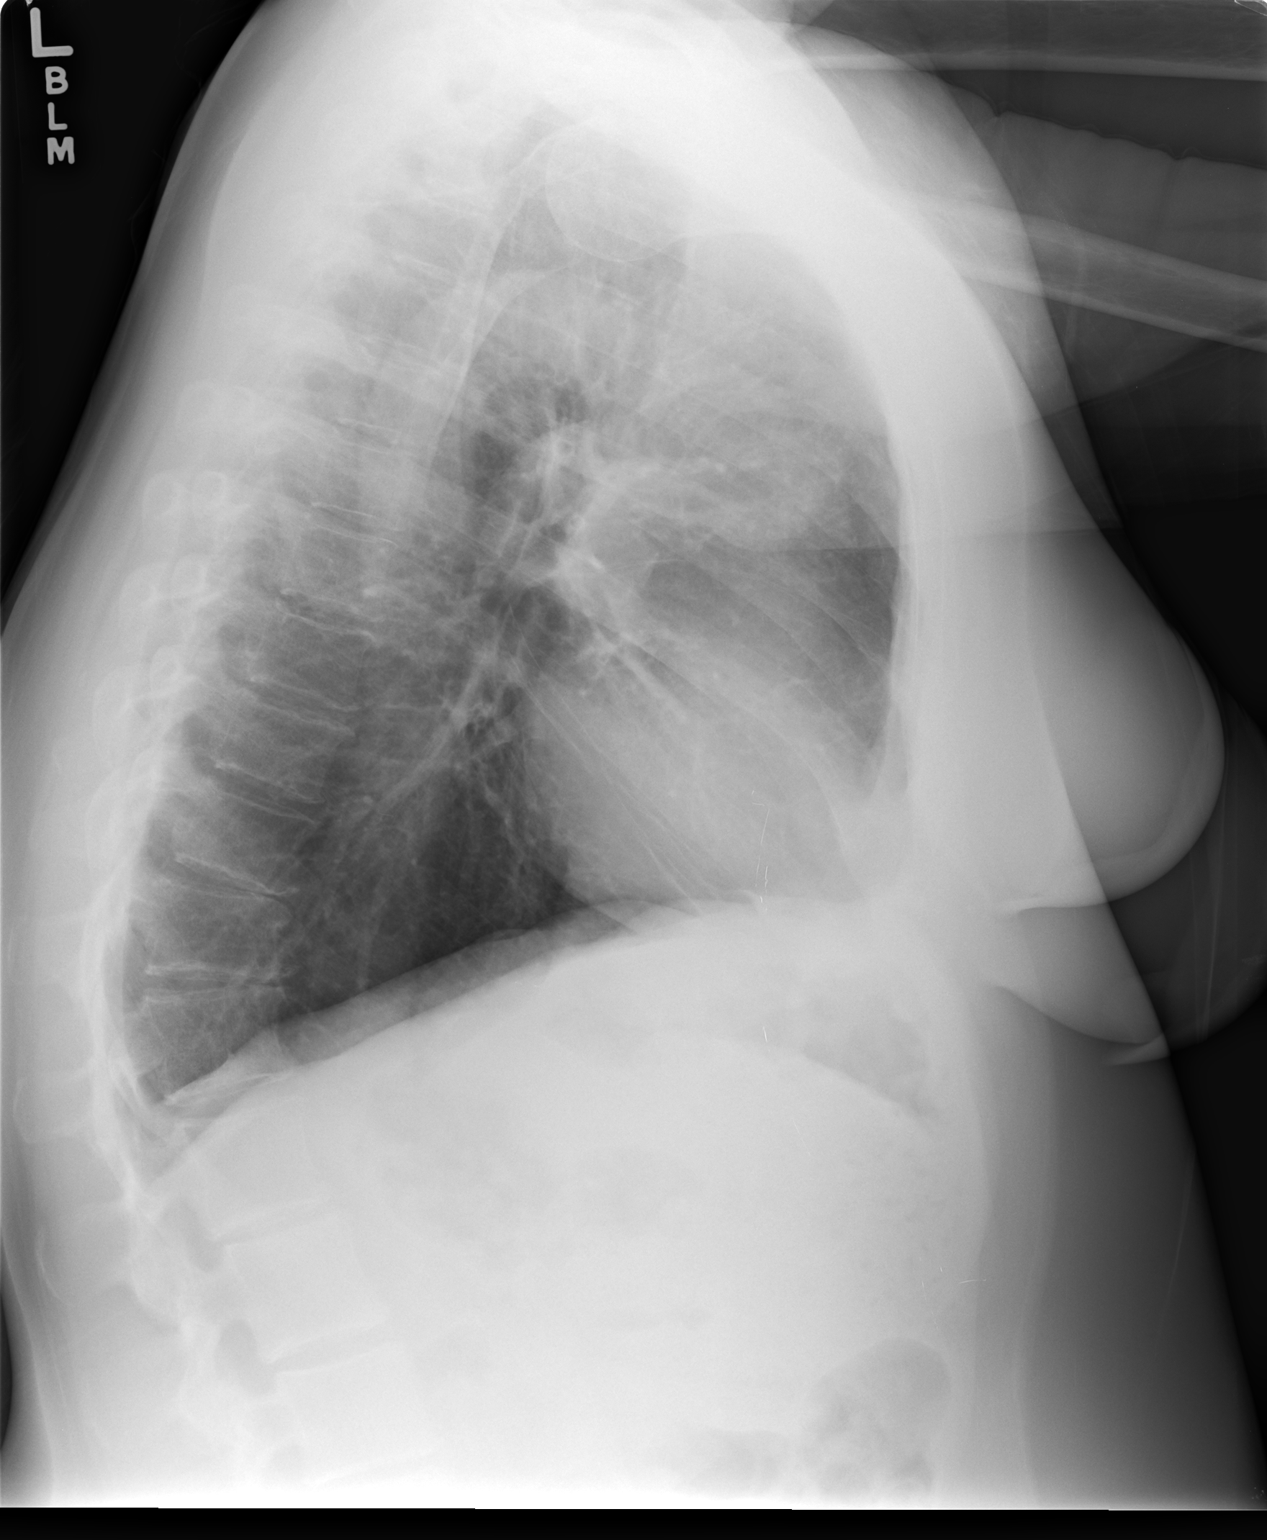

[2 of 2 positions shown; findings below may reference images not displayed]

DIAGNOSTIC STUDIES

EXAM

TWO VIEW CHEST

INDICATION

cough worsening - repeat xray [DATE]
patient c/o SOA and her cough worsening since her chest x-ray on 04/12/2019. hx COPD. BM/AK.

TECHNIQUE

PA and lateral chest views

COMPARISONS

04/12/2019, 12/23/2017, and 12/22/2017

FINDINGS

The aorta is elongated. The cardiomediastinal silhouette and pulmonary vasculature are within
normal limits. No focal infiltrate nor pleural effusions identified. Bony demineralization and
degenerative changes are present. Accentuation of thoracic kyphosis is present and unchanged.

IMPRESSION

No acute chest process and no significant change in comparison to prior studies.

Tech Notes:

patient c/o SOA and her cough worsening since her chest x-ray on 04/12/2019. hx COPD. BM/AK.

## 2021-06-04 IMAGING — CR [ID]
2 series · 2 of 2 positions shown · non-contrast
Comparison: none

[chest pa]
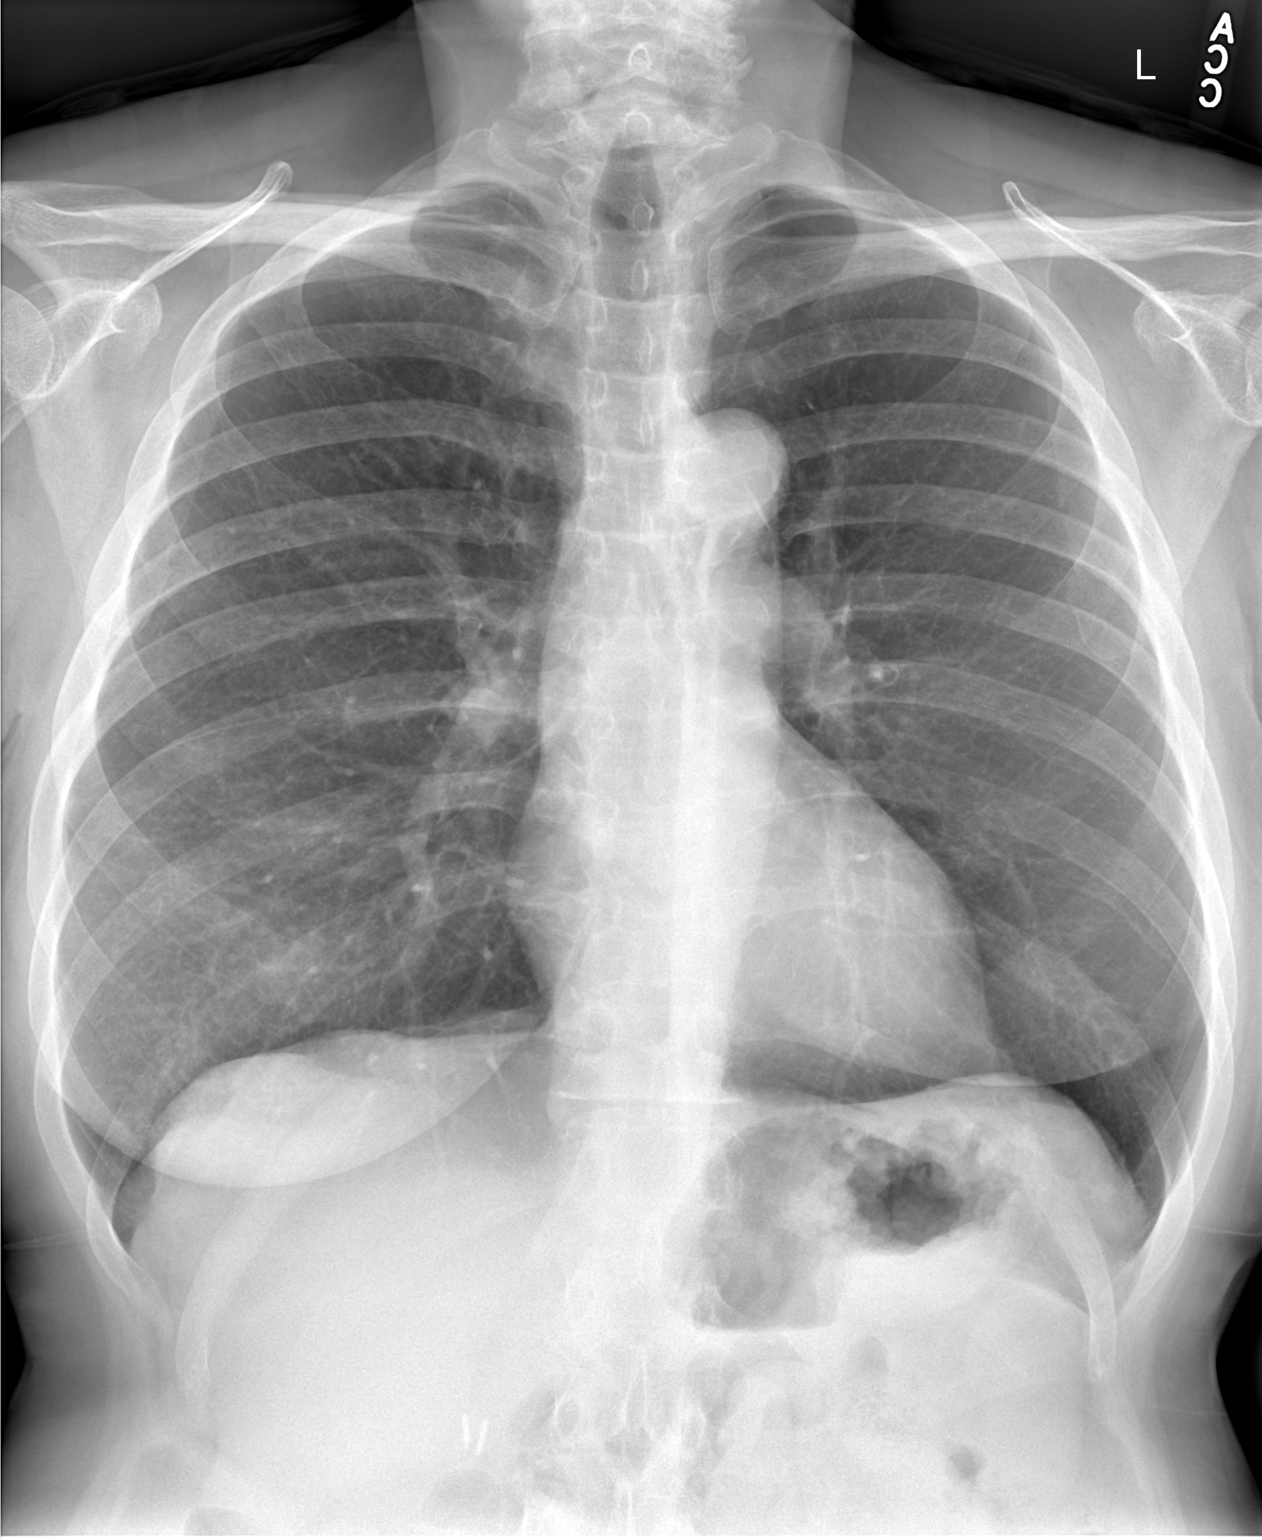

[chest lat]
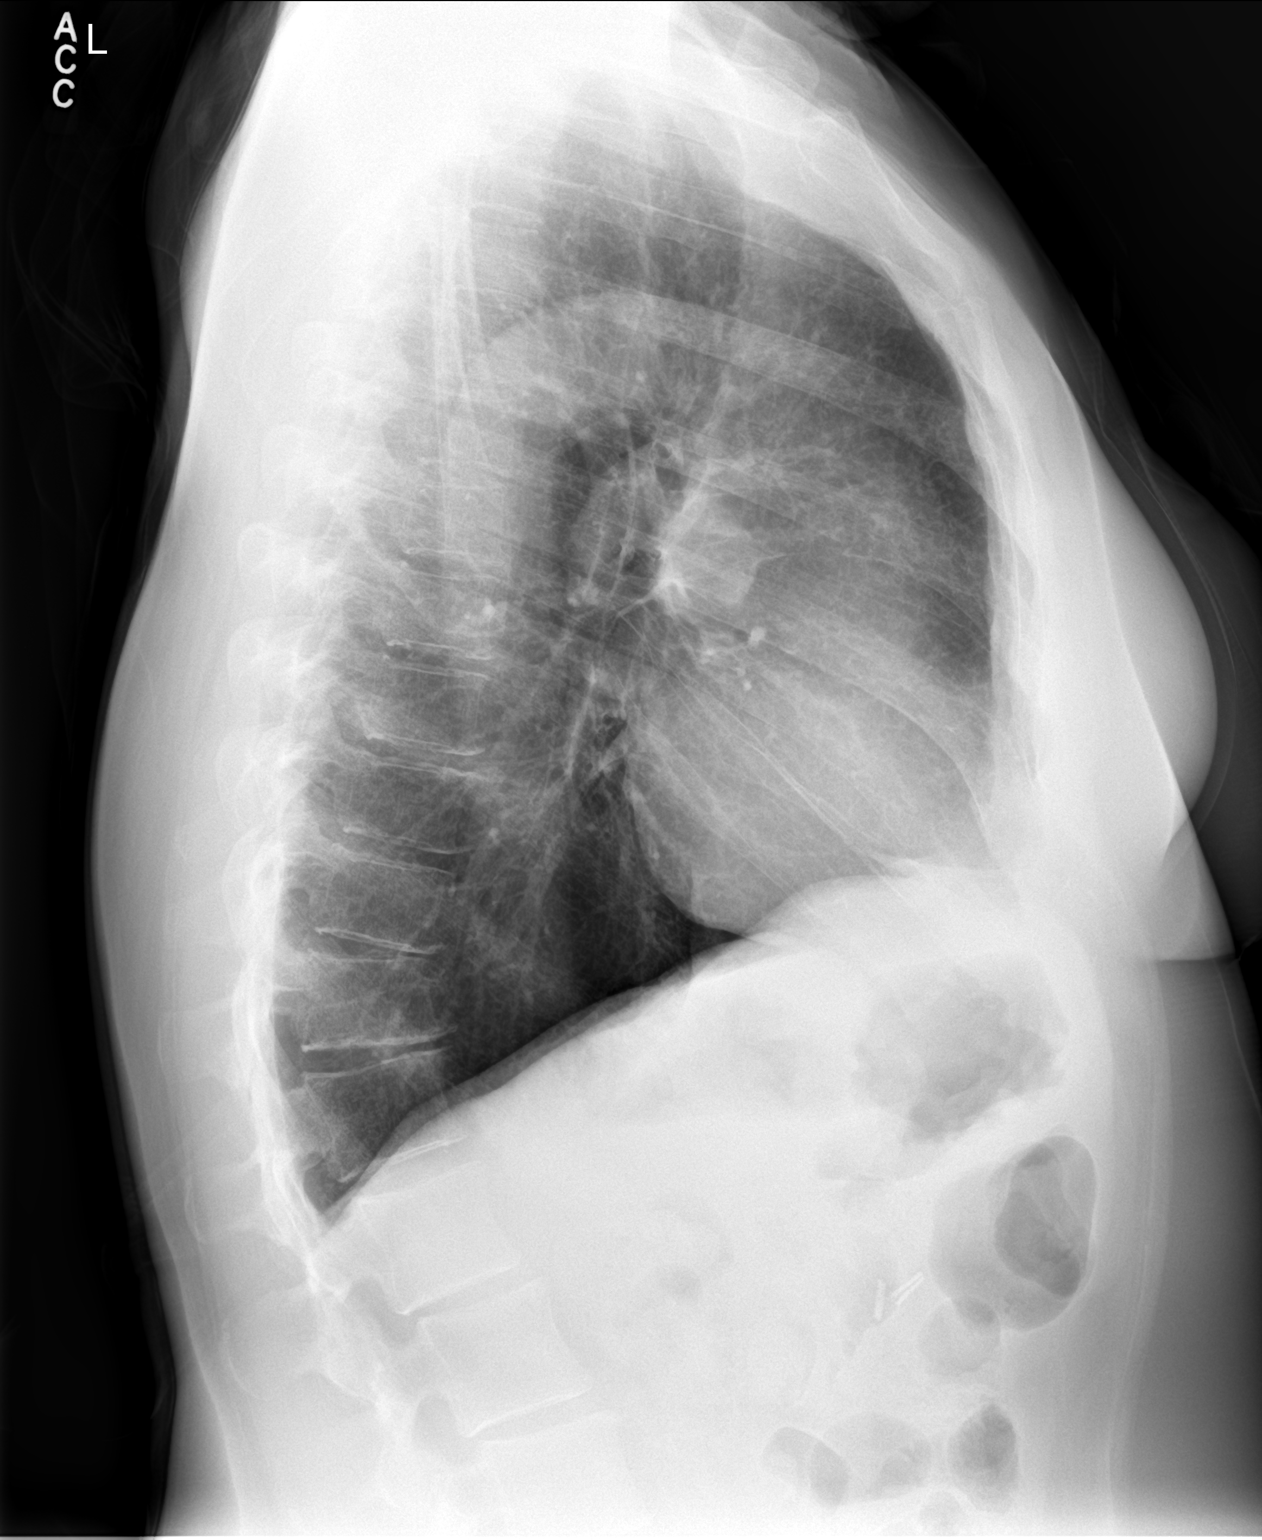

[2 of 2 positions shown; findings below may reference images not displayed]

DIAGNOSTIC STUDIES

EXAM

RADIOLOGICAL EXAMINATION, CHEST; 2 VIEWS FRONTAL AND LATERAL CPT 11818

INDICATION

dizziness
Chest pain, SOB, cough, dizziness x 2 weeks

TECHNIQUE

Frontal and lateral views of the chest were performed.

COMPARISONS

07/03/2020

FINDINGS

The heart appears normal in size. Mild bilateral hilar prominence. Mild peribronchial thickening.
No gross pneumothorax. No dense consolidation. No acute fracture. Cholecystectomy.

IMPRESSION

1. Mild peribronchial thickening. No dense consolidation.

Tech Notes:

Chest pain, SOB, cough, dizziness x 2 weeks

## 2021-11-15 IMAGING — MR MR BRAIN^MR BRAIN W & WO
8 series · 48 of 48 positions shown · non-contrast
Comparison: none

[Series 2: T1 · sagittal · 5.0mm · 0.90mm/px · 5 of 21 slices shown (1 of 2)]
[im 1/21]
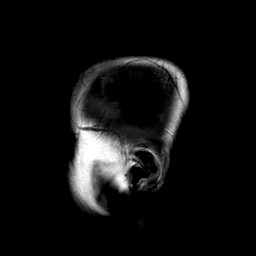
[im 6/21]
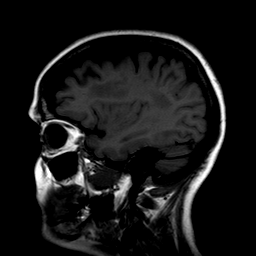
[im 11/21]
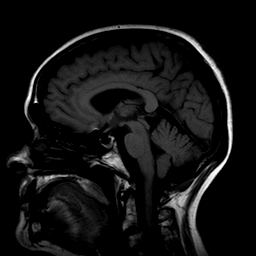
[im 16/21]
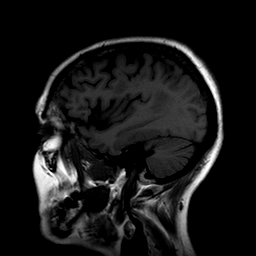
[im 21/21]
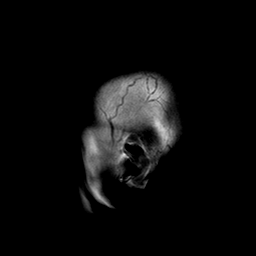

[Series 3: ep2d_diff_(id)_trace · axial · 5.0mm · 1.80mm/px · z∈[-62,+74]mm · 13 of 62 slices shown]
[im 1/62]
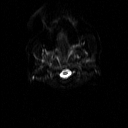
[im 6/62]
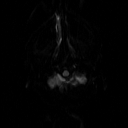
[im 11/62]
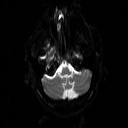
[im 16/62]
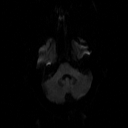
[im 21/62]
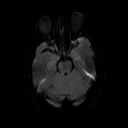
[im 26/62]
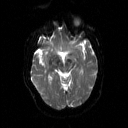
[im 31/62]
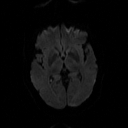
[im 36/62]
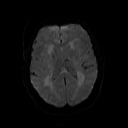
[im 41/62]
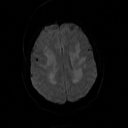
[im 46/62]
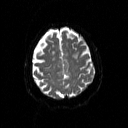
[im 51/62]
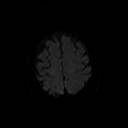
[im 56/62]
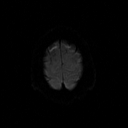
[im 62/62]
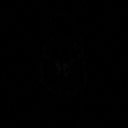

[Series 4: ep2d_diff_(id)_trace_adc · axial · 5.0mm · 1.80mm/px · z∈[-62,+74]mm · 4 of 21 slices shown]
[im 1/21]
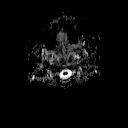
[im 7/21]
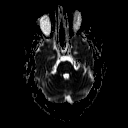
[im 14/21]
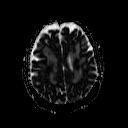
[im 21/21]
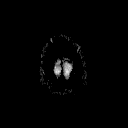

[Series 5: FLAIR · axial · 5.0mm · 0.45mm/px · z∈[-84,+71]mm · 5 of 25 slices shown]
[im 1/25]
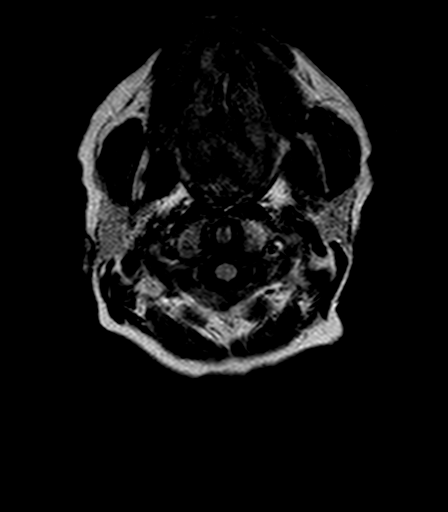
[im 7/25]
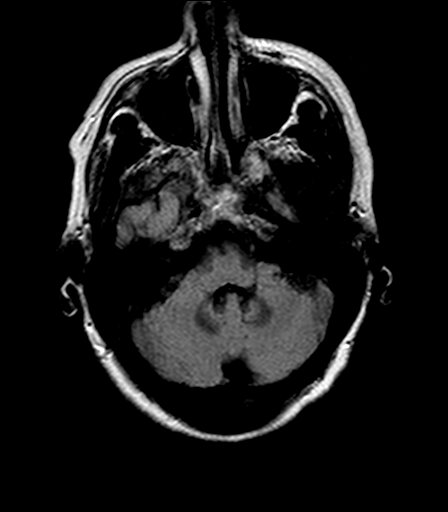
[im 13/25]
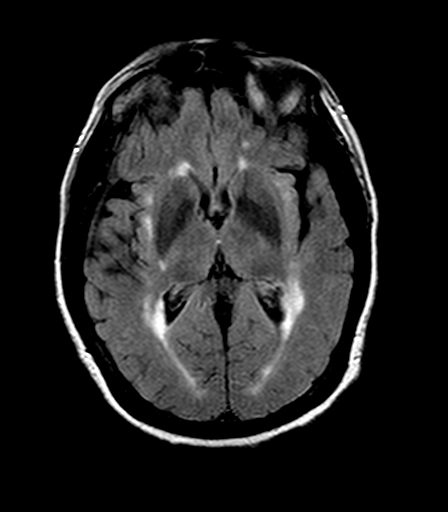
[im 19/25]
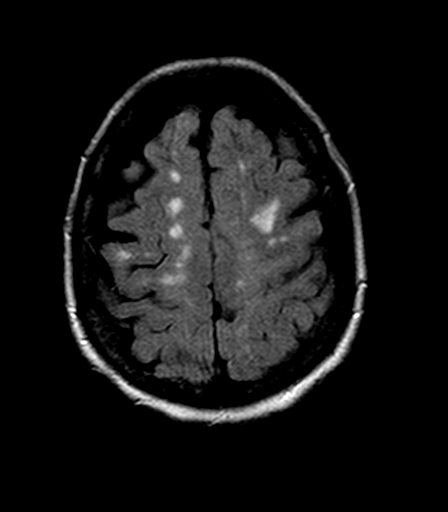
[im 25/25]
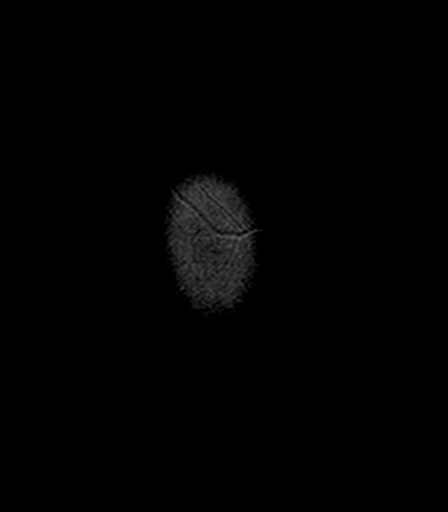

[Series 6: T2 · axial · 5.0mm · 0.45mm/px · z∈[-82,+66]mm · 5 of 24 slices shown (1 of 2)]
[im 1/24]
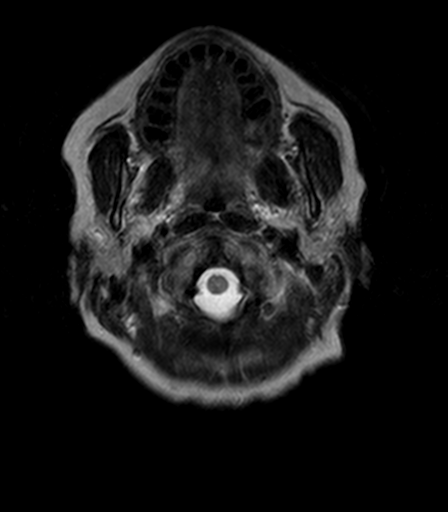
[im 6/24]
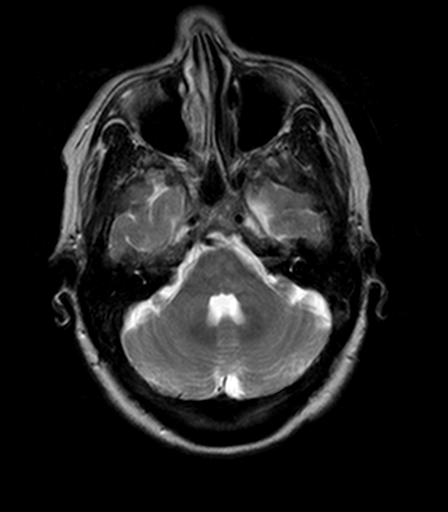
[im 12/24]
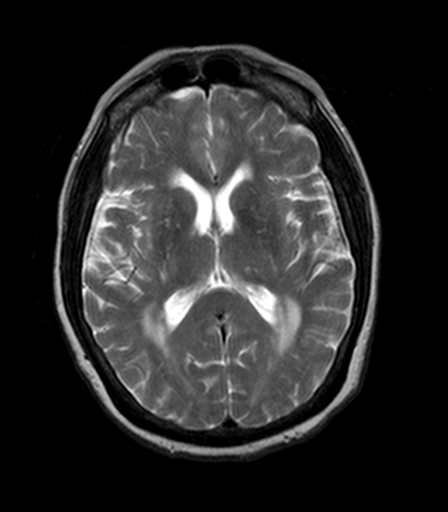
[im 18/24]
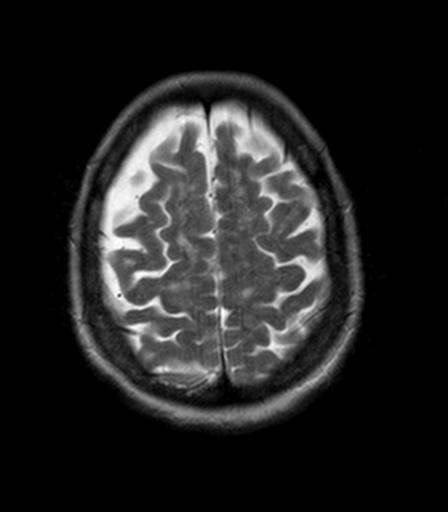
[im 24/24]
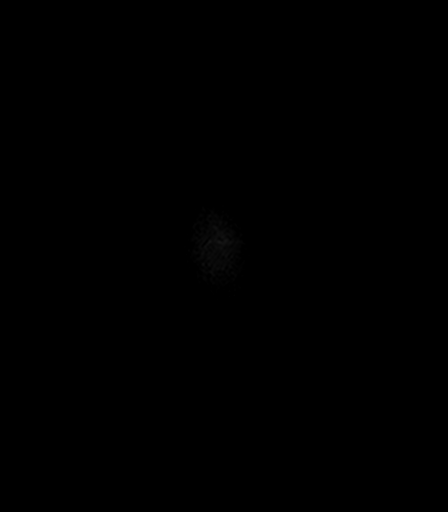

[Series 7: T1 · axial · 5.0mm · 0.90mm/px · z∈[-81,+68]mm · 5 of 24 slices shown (2 of 2)]
[im 1/24]
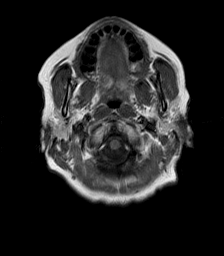
[im 6/24]
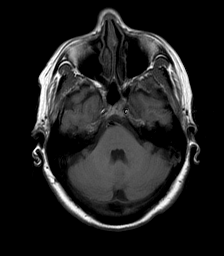
[im 12/24]
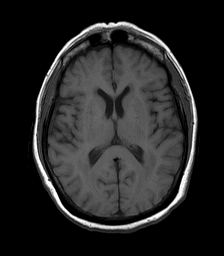
[im 18/24]
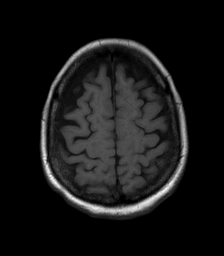
[im 24/24]
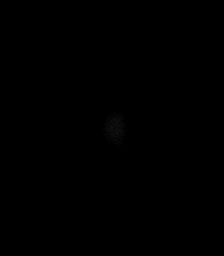

[Series 9: GRE · axial · 5.0mm · 0.86mm/px · z∈[-77,+59]mm · 5 of 22 slices shown]
[im 1/22]
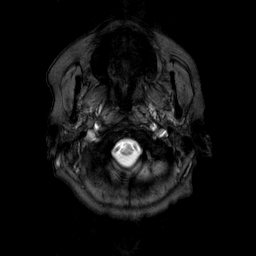
[im 6/22]
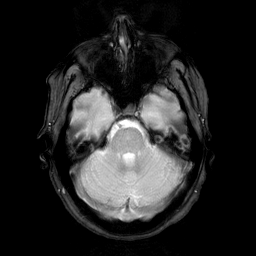
[im 11/22]
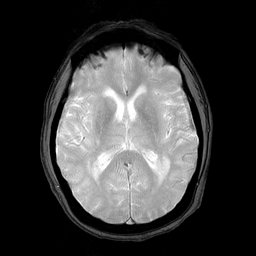
[im 16/22]
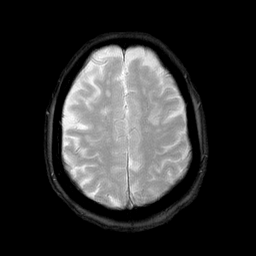
[im 22/22]
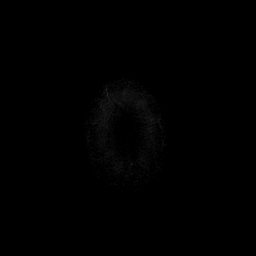

[Series 10: T2 · coronal · 5.0mm · 0.90mm/px · 6 of 26 slices shown (2 of 2)]
[im 1/26]
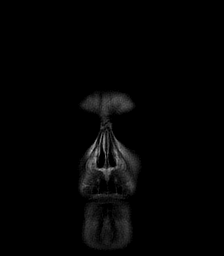
[im 6/26]
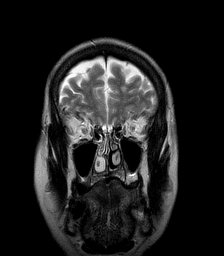
[im 11/26]
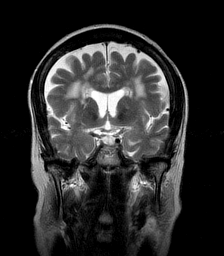
[im 16/26]
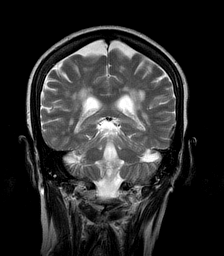
[im 21/26]
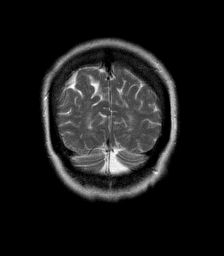
[im 26/26]
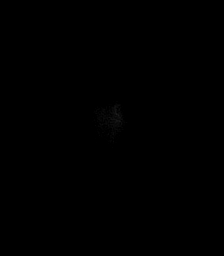

[48 of 48 positions shown; findings below may reference images not displayed]

EXAM

MR head/brain wo con

INDICATION

Dizziness, tremors, headches

TECHNIQUE

Multiplanar, multisequence imaging of the brain was performed without contrast.

COMPARISONS

None available at the time of dictation.

FINDINGS

Parenchyma: No diffusion restriction, acute hemorrhage, midline shift, or herniation. Moderate to
severe scattered nonspecific T2/FLAIR hyperintensities within the periventricular, subcortical, and
centrum semiovale white matter, which most likely represents chronic microvascular ischemic change.

Ventricles: No hydrocephalus.

Extra-axial spaces: No extra-axial collection. Mildly prominent retrocerebellar CSF space.

Flow voids: Intact.

Other: Bony calvarium is intact. Trace fluid in the mastoid air cells.

IMPRESSION
1. No MR evidence of an acute intracranial abnormality.
2. Moderate to severe chronic microvascular ischemic change.

Tech Notes:

CONFUSION, MEMORY LOSS.  WORSE SINCE HAVING SEPSIS 4 YEARS AGO.  RG

## 2021-12-13 ENCOUNTER — Encounter: Admit: 2021-12-13 | Discharge: 2021-12-13 | Payer: Private Health Insurance - Indemnity

## 2021-12-13 ENCOUNTER — Ambulatory Visit: Admit: 2021-12-13 | Discharge: 2021-12-13 | Payer: Private Health Insurance - Indemnity

## 2021-12-13 DIAGNOSIS — R011 Cardiac murmur, unspecified: Secondary | ICD-10-CM

## 2021-12-13 DIAGNOSIS — R0600 Dyspnea, unspecified: Secondary | ICD-10-CM

## 2021-12-13 IMAGING — US ECHOCOMPL
1 series · 12 of 24 positions shown · non-contrast
Comparison: none

[Series 1: us echo 2d, complete · 109 acquisitions, 12 frames shown]
[im 5/109]
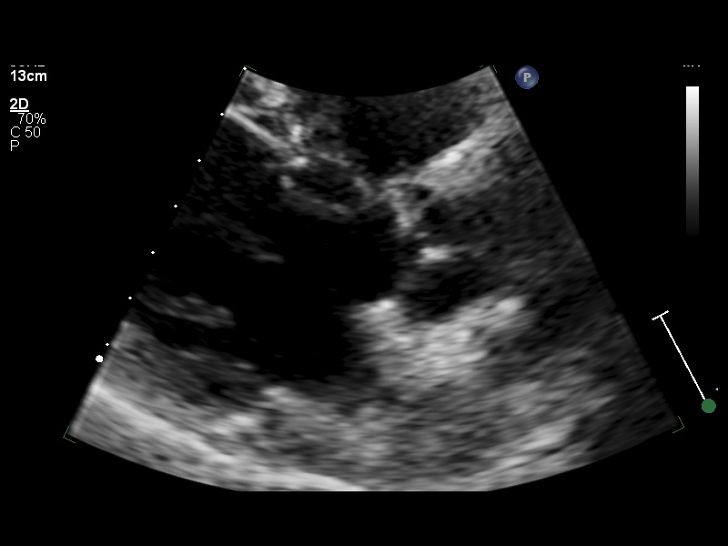
[im 15/109]
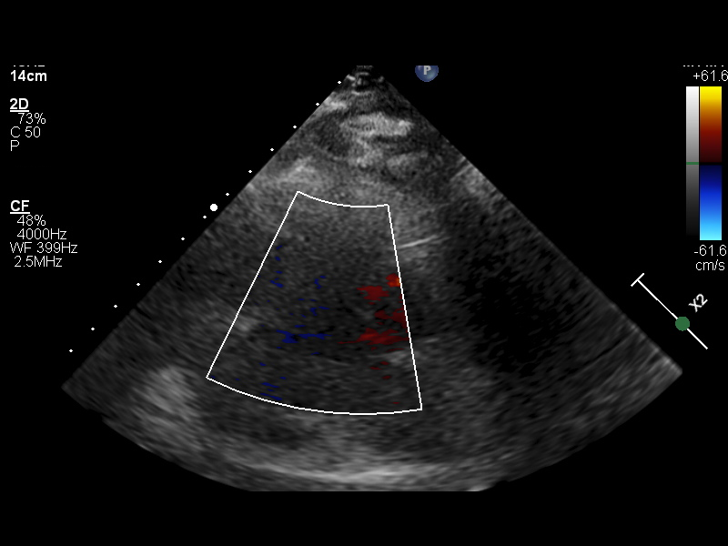
[im 19/109]
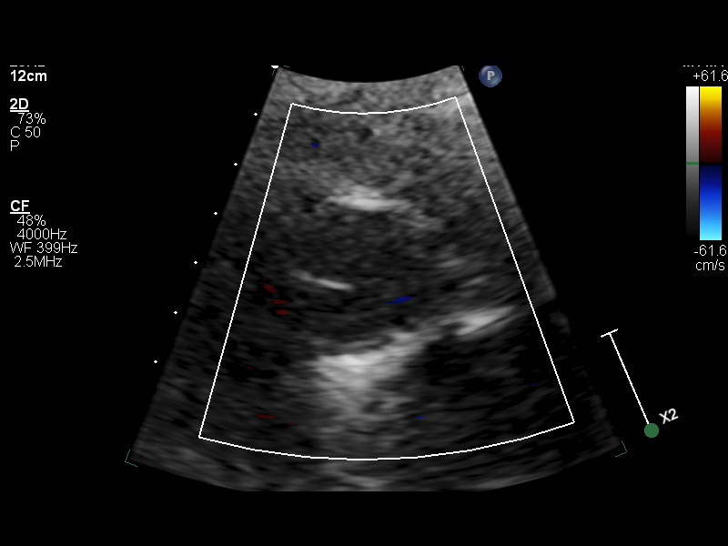
[im 33/109]
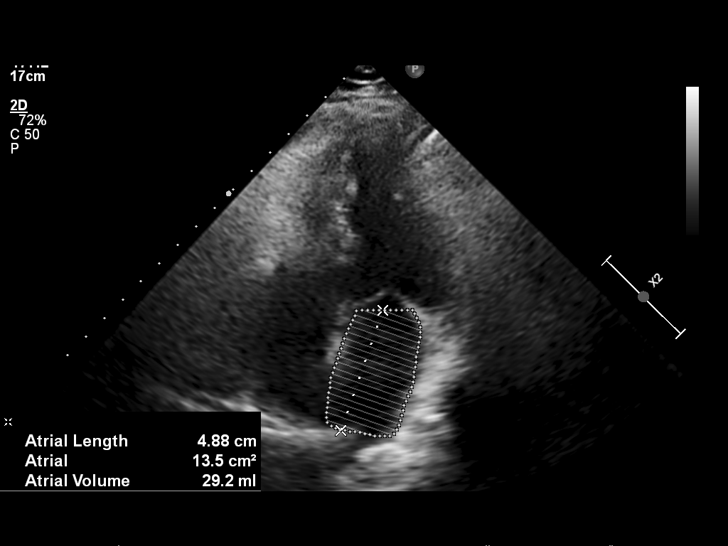
[im 43/109]
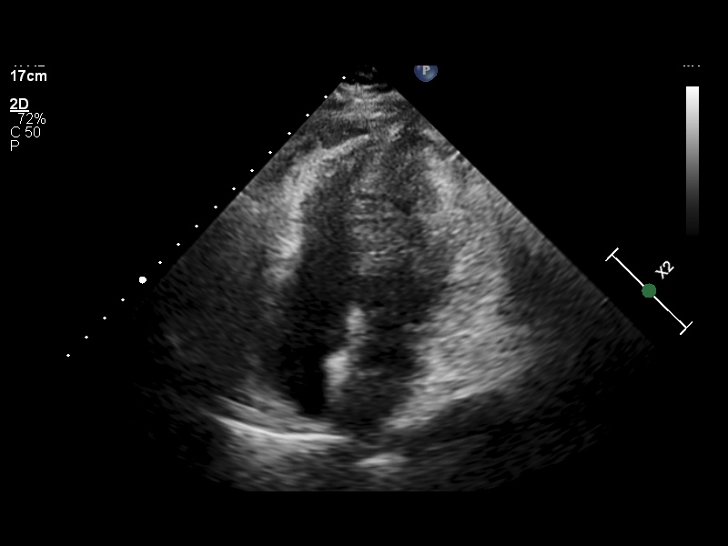
[im 57/109]
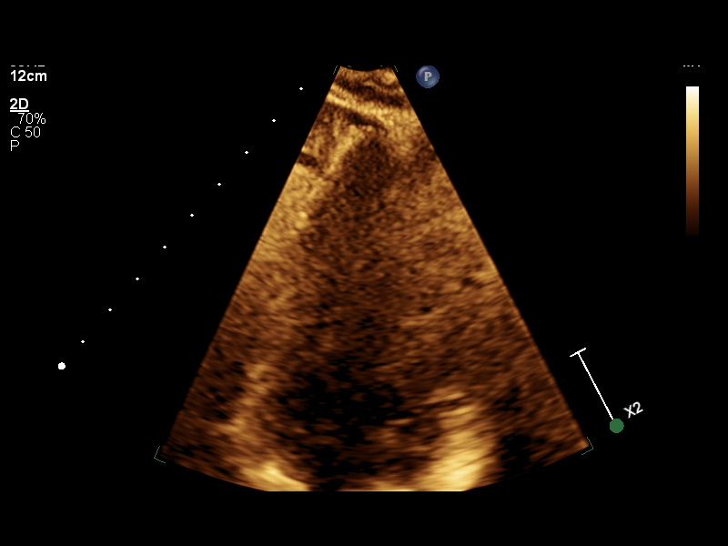
[im 62/109]
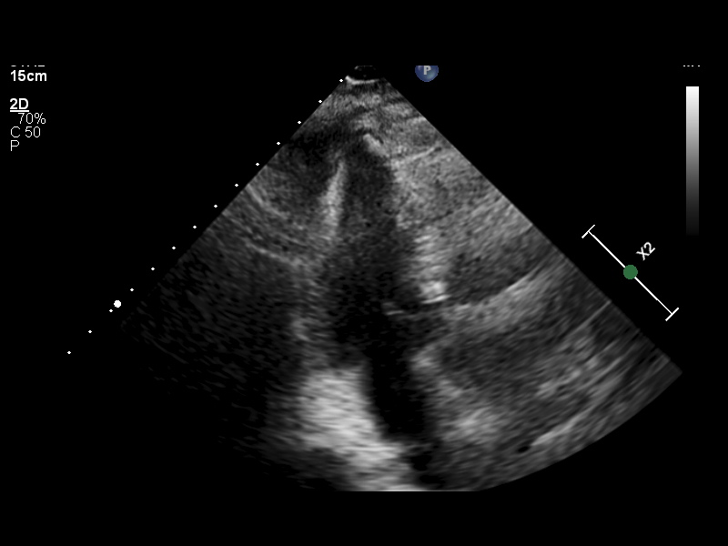
[im 71/109]
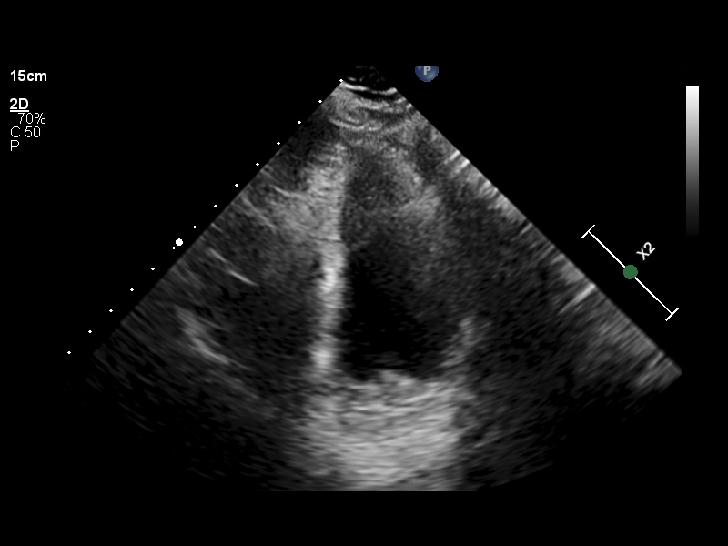
[im 80/109]
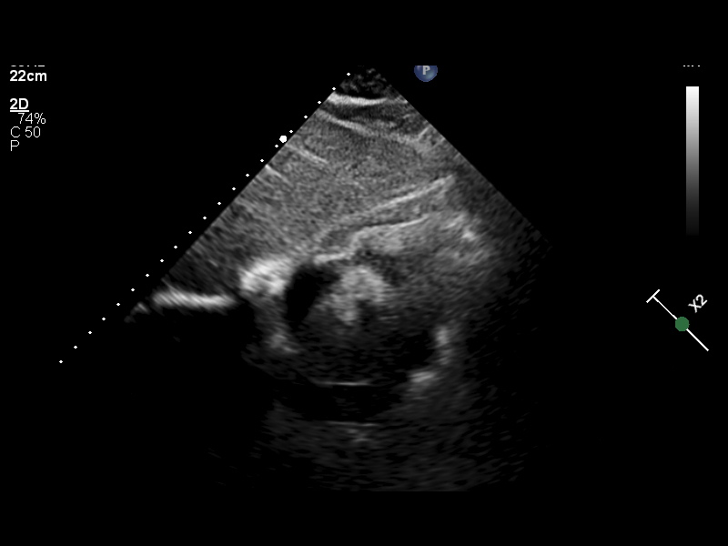
[im 90/109]
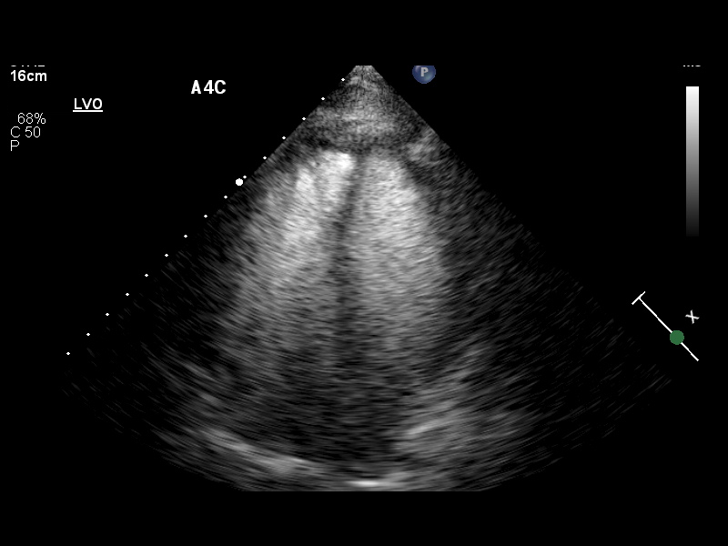
[im 99/109]
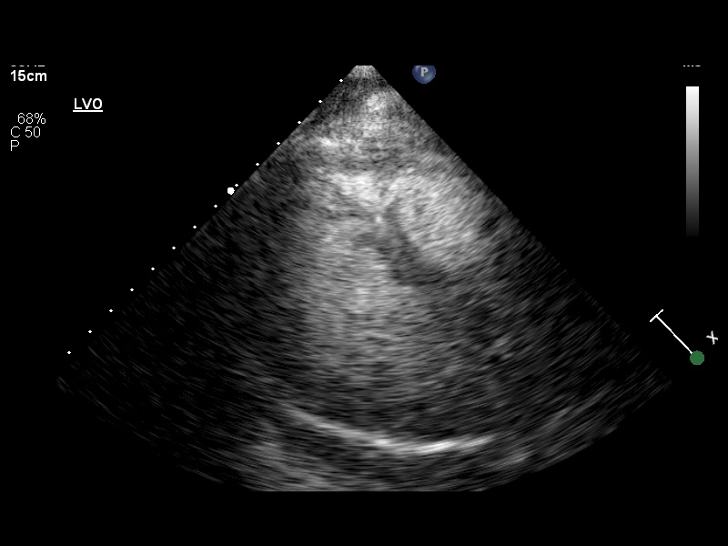
[im 109/109]
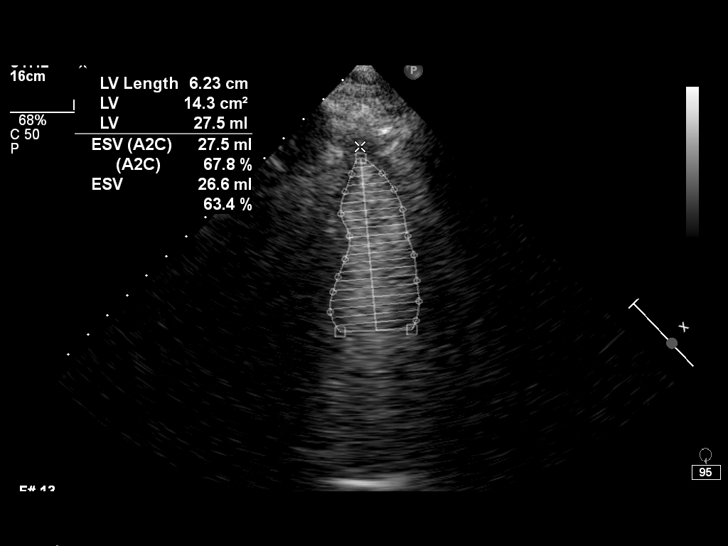

[12 of 24 positions shown; findings below may reference images not displayed]

12/13/21 -  2D + DOPPLER ECHO
Location Performed: [HOSPITAL]

Referring Provider:
Fellow:
Location of Interp:
Sonographer: External Staff

Indications: Murmur     Dyspnea

Definity contrast was given to enhance imaging. Technically difficult study.

Vitals
Height   Weight   BSA (Calculated)   BP   Comments
157.5 cm (5' 2")   64 kg (141 lb)   1.67   112/68

Interpretation Summary
Technically difficult study.
The left ventricular size is normal. Wall thickness is increased. Moderate basal septal hypertrophy.
The left ventricular systolic function is normal. The visually estimated ejection fraction is 55%.
There are no segmental wall motion abnormalities.
The right ventricular size is normal. The right ventricular systolic function is normal.
Normal biatrial size.
No Doppler evidence of significant valvular disease.
No pericardial effusion.
Compared with study dated 01/12/2018, no significant change is noted.

Echocardiographic Findings
Left Ventricle   The left ventricular size is normal. Wall thickness is increased. Moderate
predominantly basal septal hypertrophy. The left ventricular systolic function is normal. The
visually estimated ejection fraction is 55%. There are no segmental wall motion abnormalities. Grade
I (mild) left ventricular diastolic dysfunction. Normal left atrial pressure. There is no evidence
of LVOT obstruction.
Right Ventricle   The right ventricular size is normal. The right ventricular systolic function is
normal.
Left Atrium   Normal size.
Right Atrium   Normal size.
IVC/SVC   Normal central venous pressure (0-5 mm Hg).
Mitral Valve   Normal valve structure. No stenosis. Trace regurgitation.
Tricuspid Valve   Trace regurgitation.
Aortic Valve   The aortic valve was not well seen. No stenosis. No regurgitation.
Pulmonary   The pulmonic valve was not seen well but no Doppler evidence of stenosis. No
regurgitation.
Aorta   The aortic root and ascending aorta are normal in size.
Pericardium   No pericardial effusion.

Left Ventricular Wall Scoring
Resting   Score Index: 1.000   Percent Normal: 100.0%

The left ventricular wall motion is normal.

Left Heart 2D Measurements (Normal Ranges)
EF (Visual)
55 %
LVIDD
3.9 cm  (Range: 3.8 - 5.2)
LVIDS
2.5 cm  (Range: 2.2 - 3.5)
IVS
1.4 cm  (Range: 0.6 - 0.9)
LV PW
1.2 cm  (Range: 0.6 - 0.9)
LA Size
3.3 cm  (Range: 2.7 - 3.8)

Right Heart 2D   M-Mode Measurements (Normal Ranges) (Range)
RV Basal Dia
2.9 cm  (2.5 - 4.1)
RV Mid Dia
1.8 cm  (1.9 - 3.5)
KLEVER
14.7 cm2  (<18)
M-Mode TAPSE
2.1 cm  (>1.7)

Left Heart 2D Addnl Measurements (Normal Ranges)
LV Systolic Vol
27 mL  (Range: 14 - 42)
LV Systolic Vol Index
16 mL/m2  (Range: 8 - 24)
LV Diastolic Vol
73 mL  (Range: 46 - 106)
LV Diastolic Vol Index
44 mL/m2  (Range: 29 - 61)
LA Vol
29 mL  (Range: 22 - 52)
LA Vol Index
17.37 mL/m2  (Range: 16 - 34)
LV Mass
180 g  (Range: 67 - 162)
LV Mass Index
108 g/m2  (Range: 43 - 95)
RWT
0.62  (Range: <=0.42)

Aortic Root Measurements (Normal Ranges)
Sinus
3.2 cm  (Range: 2.4 - 3.6)
ANURUDDH RANO
3.2 cm

Doppler (Spectral and Color Flow)
Estimated Peak Systolic PA Pressure
Aortic valve peak velocity
1.5 m/s

Tech Notes:

Definity RT [id number]. Reviewed allergies. JL

## 2021-12-24 IMAGING — MR AGHEADWO
7 series · 39 of 48 positions shown · non-contrast
Comparison: none

[Series 1: aahead_scout · sagittal · 1.6mm · 1.62mm/px · 15 of 123 slices shown]
[im 1/123]
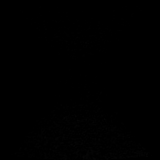
[im 9/123]
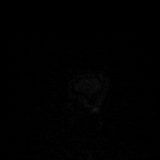
[im 18/123]
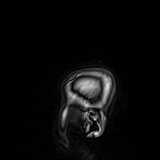
[im 27/123]
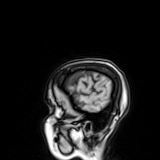
[im 35/123]
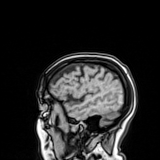
[im 44/123]
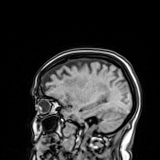
[im 53/123]
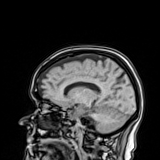
[im 61/123]
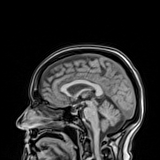
[im 70/123]
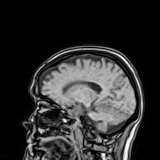
[im 79/123]
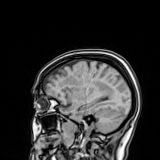
[im 88/123]
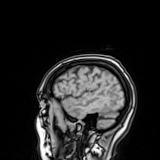
[im 96/123]
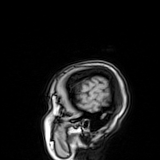
[im 105/123]
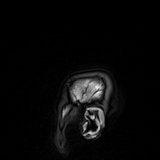
[im 114/123]
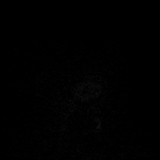
[im 123/123]
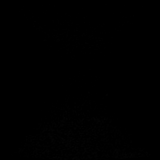

[Series 2: aahead_scout_mpr_sag · sagittal · 1.6mm · 1.60mm/px · 1 of 5 slices shown]
[im 1/5]
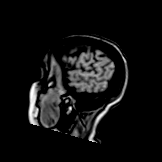

[Series 3: aahead_scout_mpr_cor · coronal · 1.6mm · 1.60mm/px · 1 of 3 slices shown]
[im 1/3]
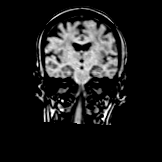

[Series 4: aahead_scout_mpr_(person_name) · axial · 1.6mm · 1.60mm/px · 1 of 3 slices shown]
[im 1/3]
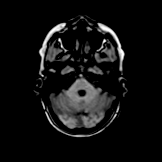

[Series 13: vessel_scout · coronal · 5.0mm · 1.37mm/px · 2 of 26 slices shown]
[im 1/26]
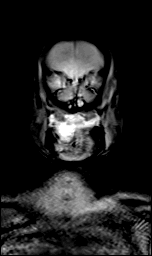
[im 26/26]
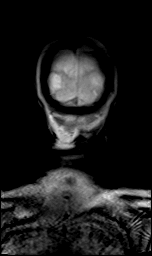

[Series 14: vessel_scout_msum · coronal · 5.0mm · 1.37mm/px · 3 of 22 slices shown]
[im 1/22]
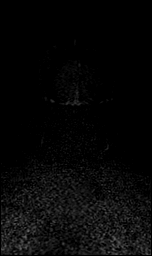
[im 11/22]
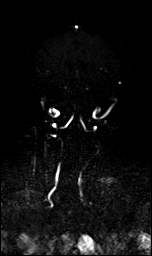
[im 22/22]
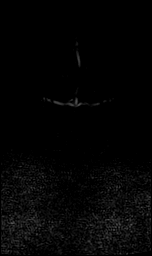

[Series 17: TOF · axial · 0.5mm · 0.35mm/px · z∈[-113,-20]mm · 16 of 196 slices shown]
[im 1/196]
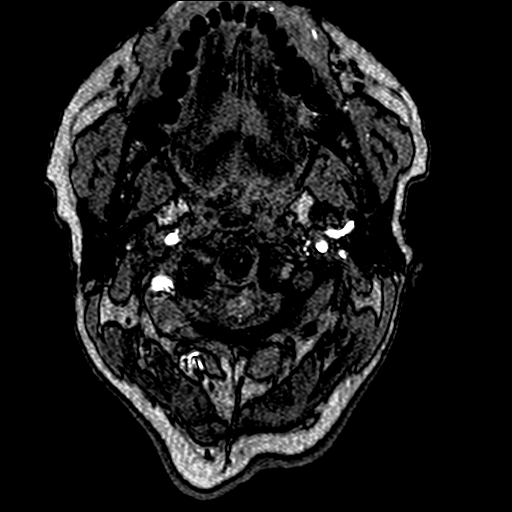
[im 9/196]
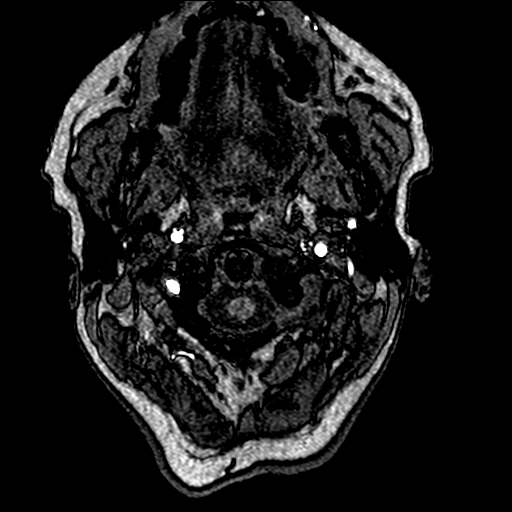
[im 17/196]
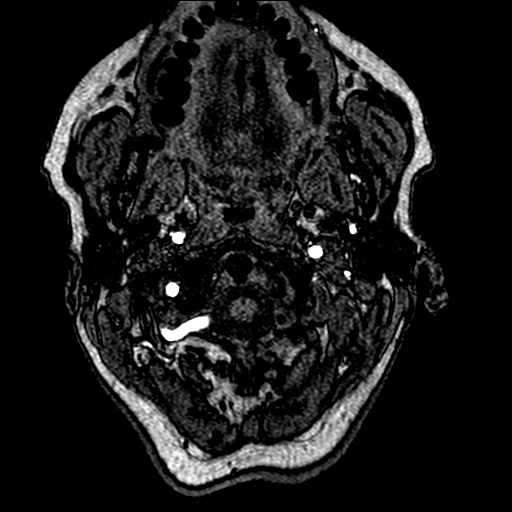
[im 25/196]
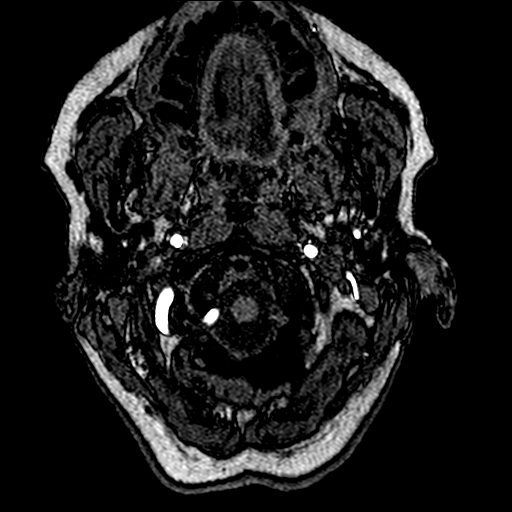
[im 33/196]
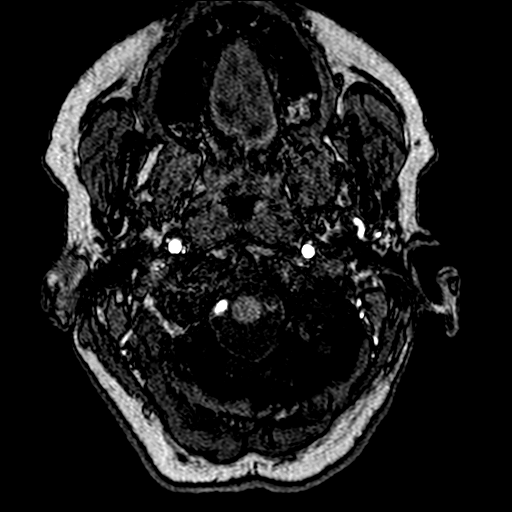
[im 41/196]
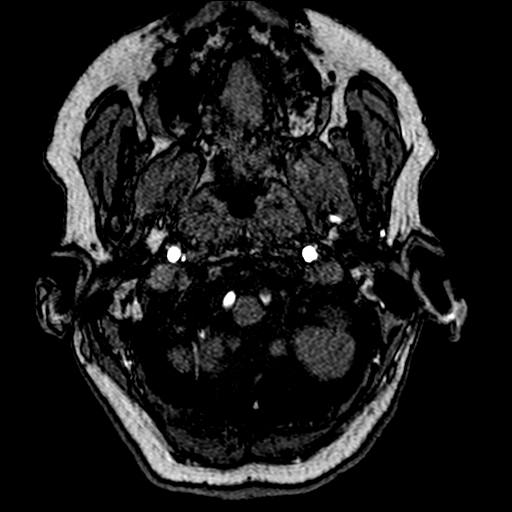
[im 49/196]
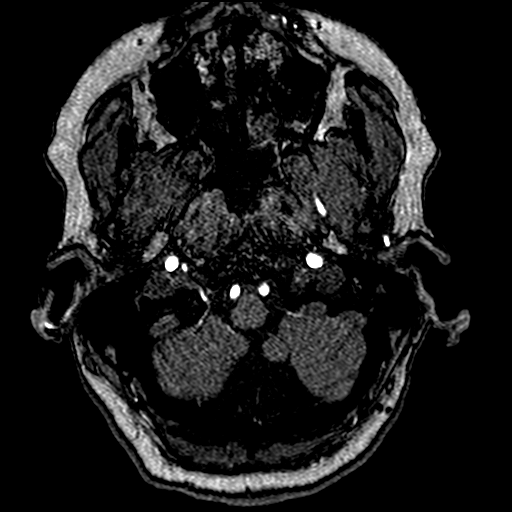
[im 57/196]
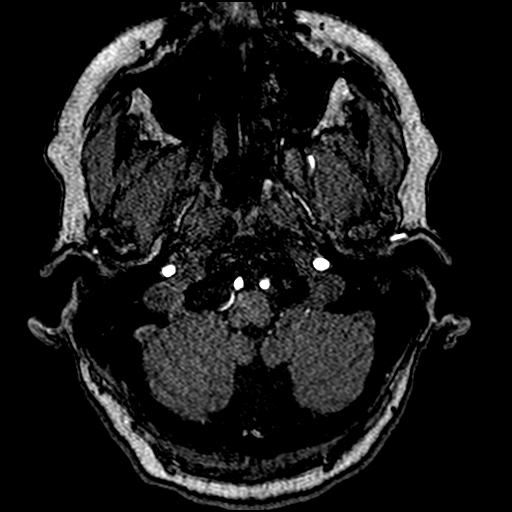
[im 66/196]
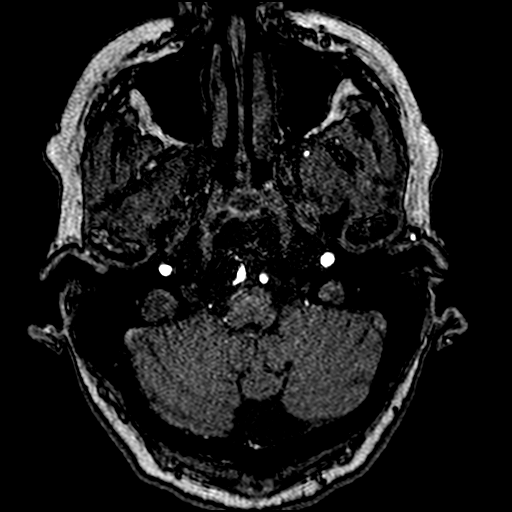
[im 74/196]
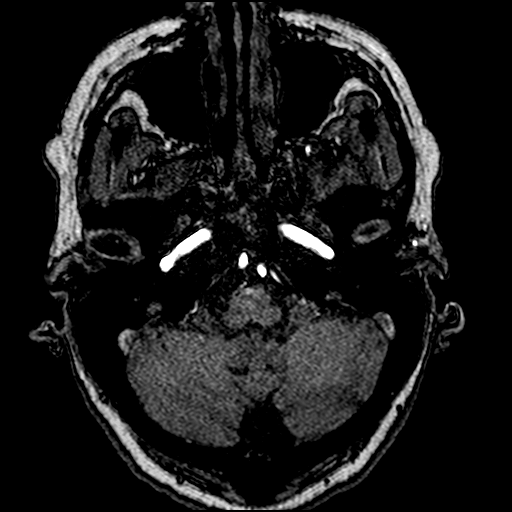
[im 82/196]
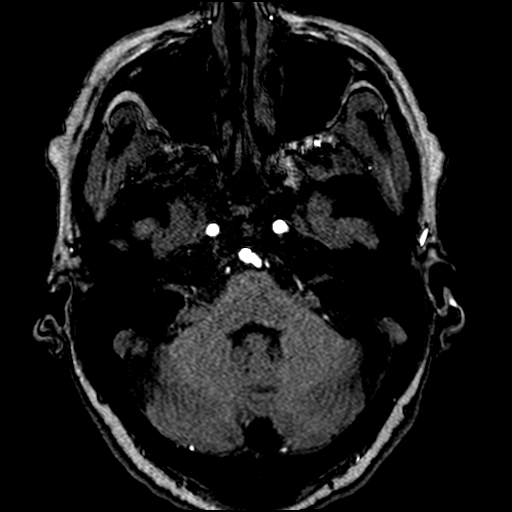
[im 90/196]
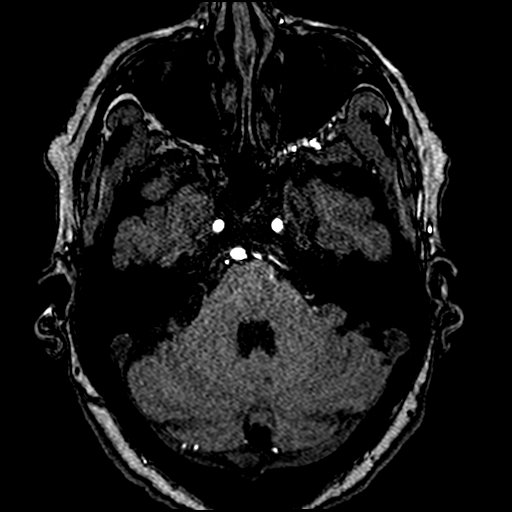
[im 114/196]
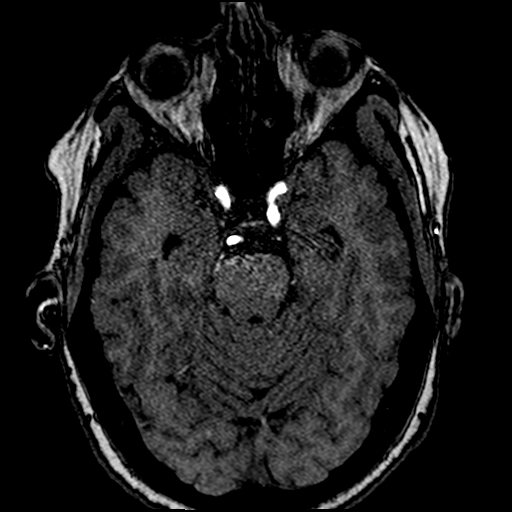
[im 139/196]
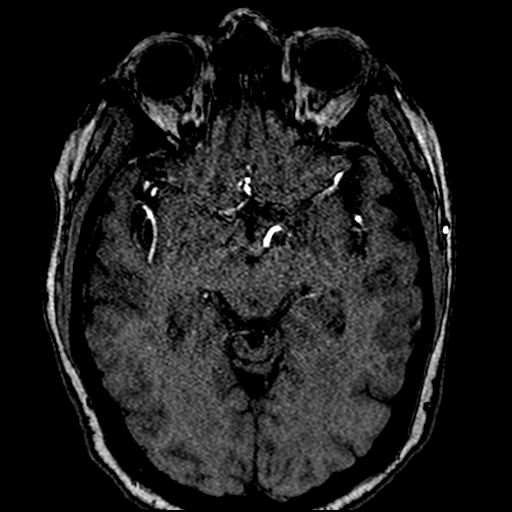
[im 163/196]
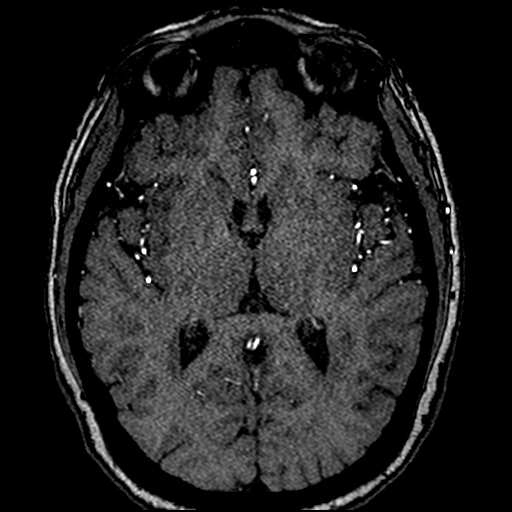
[im 187/196]
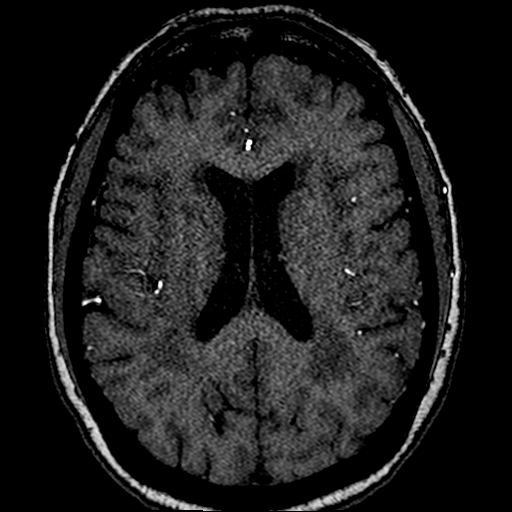

[39 of 48 positions shown; findings below may reference images not displayed]

EXAM

MRA circle Willis.

INDICATION

severe microvascular ischemic dz
INCREASING EPISODES OF TIA SYMPTOMS, CONFUSION, DIZZINESS, MEMORY LOSS, EXHAUSTION, WEAKNESS, 15 ML
[facility] RG

FINDINGS

The distal extreme vertebral arteries are maintained as is the basilar artery and posterior cerebral
arteries.
The left vertebral artery in the upper neck is not opacified.

The distal internal carotid arteries are maintained as are the anterior and middle cerebral vessels.

IMPRESSION

While the distal vertebral arteries are patent the left vertebral artery of the upper neck does
not appear opacified. This may represent proximal occlusion with reflux filling from the right
system.

CT angiography may be of benefit further evaluation.

Tech Notes:

INCREASING EPISODES OF TIA SYMPTOMS, CONFUSION, DIZZINESS, MEMORY LOSS, EXHAUSTION, WEAKNESS, 15 ML
[facility] RG

## 2021-12-24 IMAGING — MR AGNECKWW
3 of 6 series · 13 of 48 positions shown · non-contrast
Comparison: none

[Series 9: cor slab pre · coronal · non-contrast · 0.7mm · 0.50mm/px · 7 of 112 slices shown]
[im 1/112]
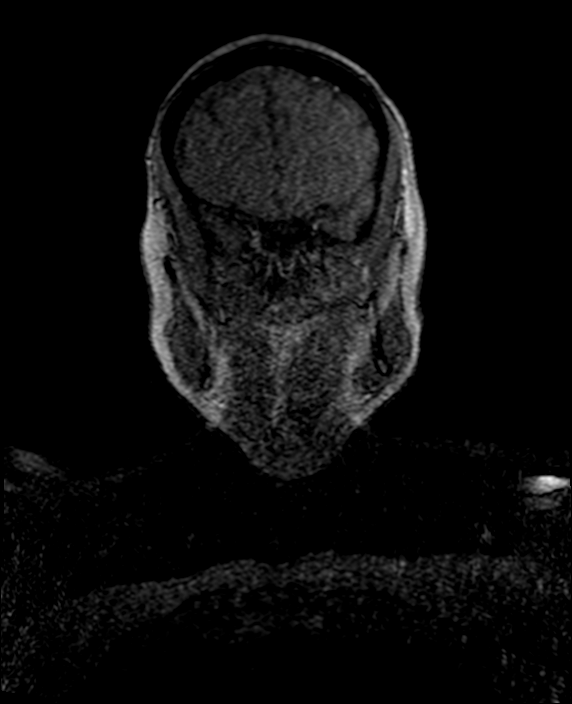
[im 13/112]
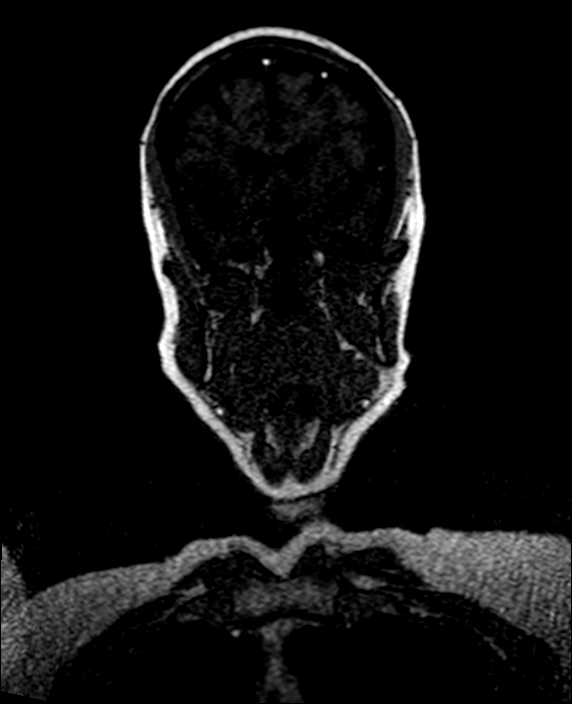
[im 38/112]
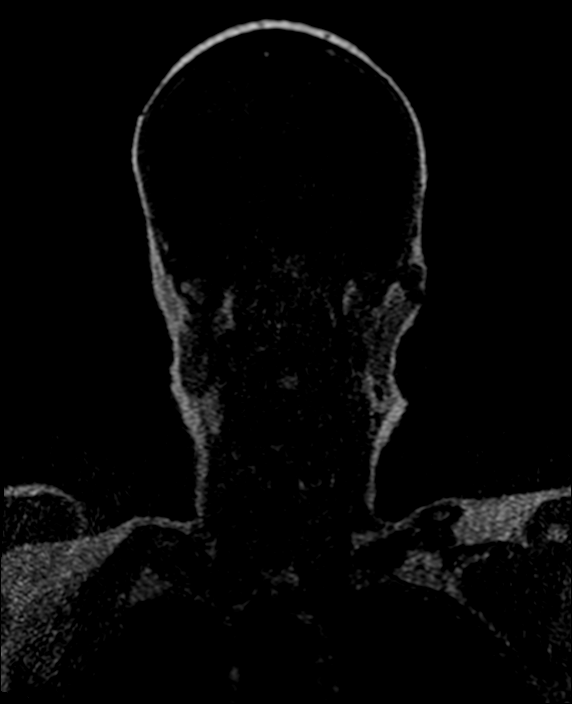
[im 50/112]
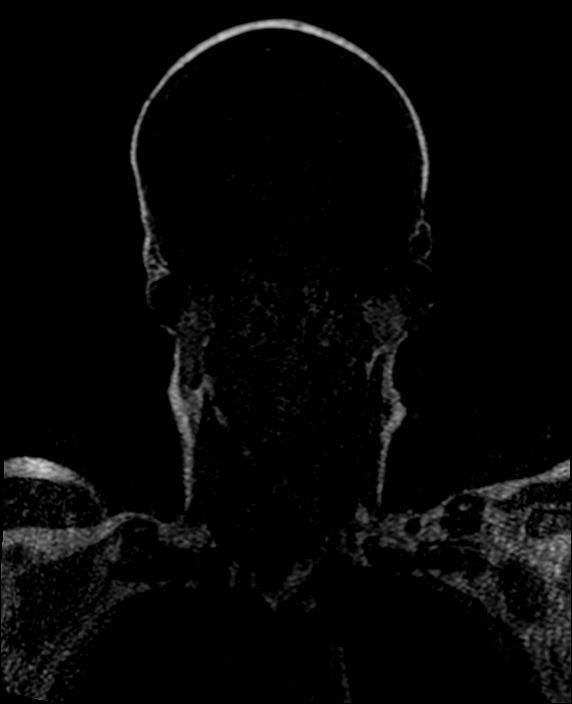
[im 62/112]
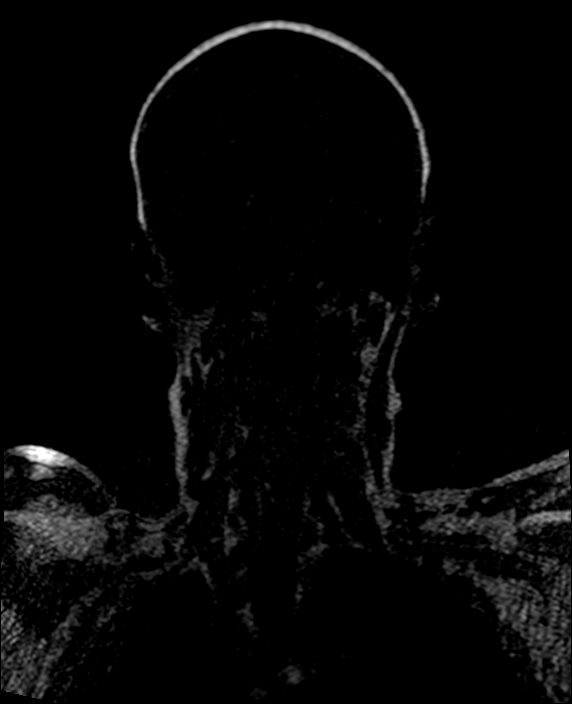
[im 75/112]
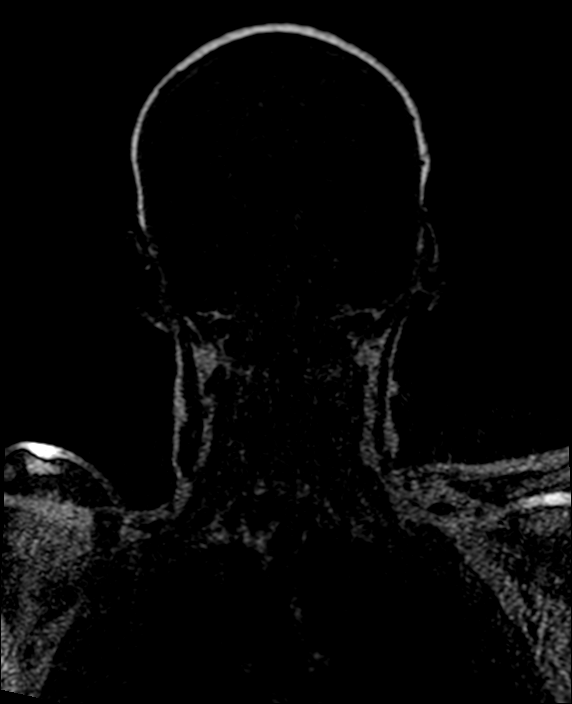
[im 99/112]
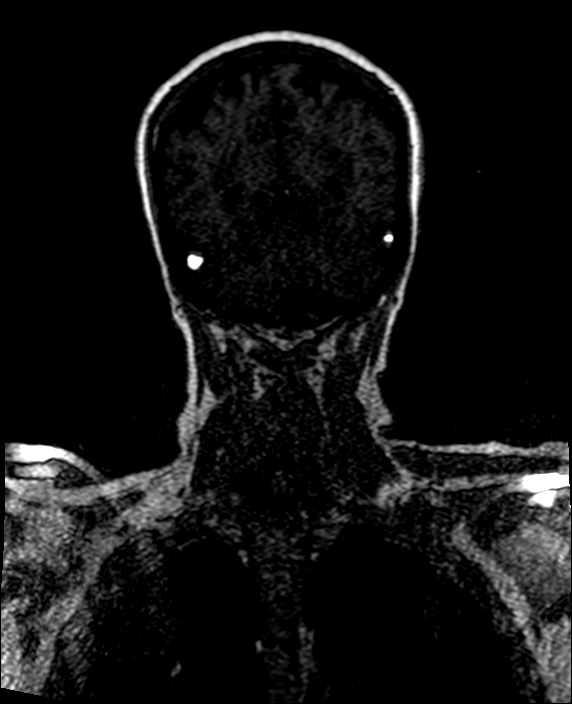

[Series 11: cor slab post · coronal · 0.7mm · 0.50mm/px · 3 of 112 slices shown]
[im 13/112]
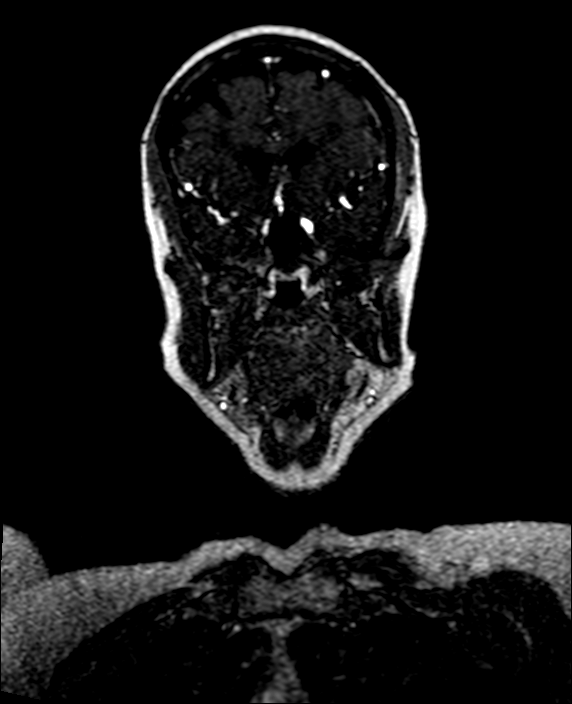
[im 62/112]
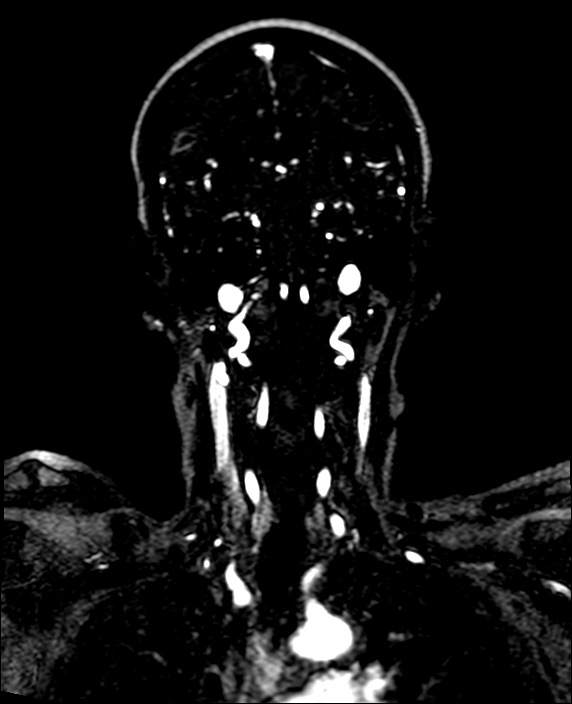
[im 99/112]
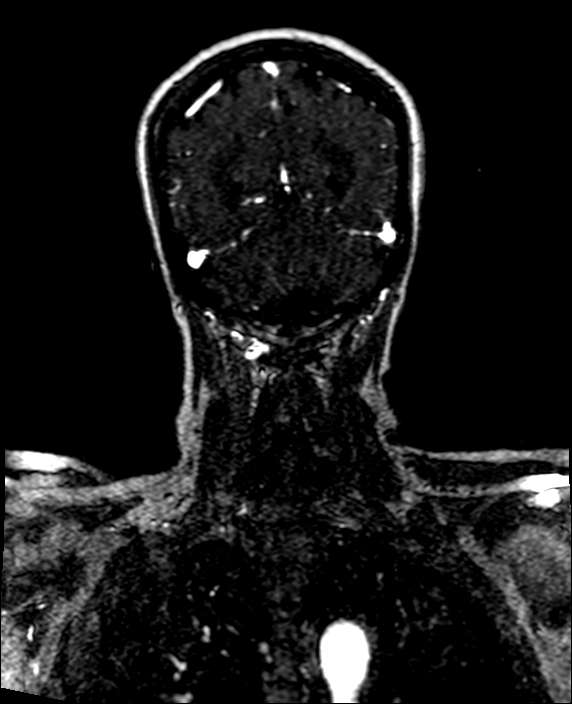

[Series 12: cor slab post_sub · coronal · 0.7mm · 0.50mm/px · 3 of 112 slices shown]
[im 13/112]
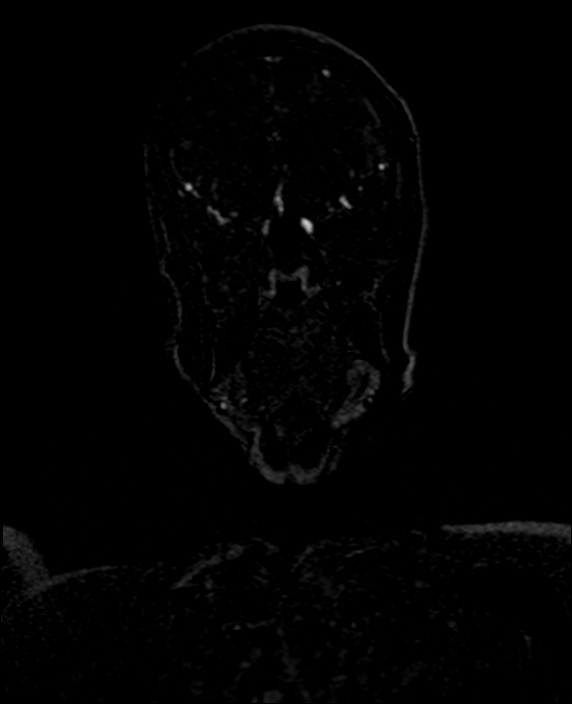
[im 62/112]
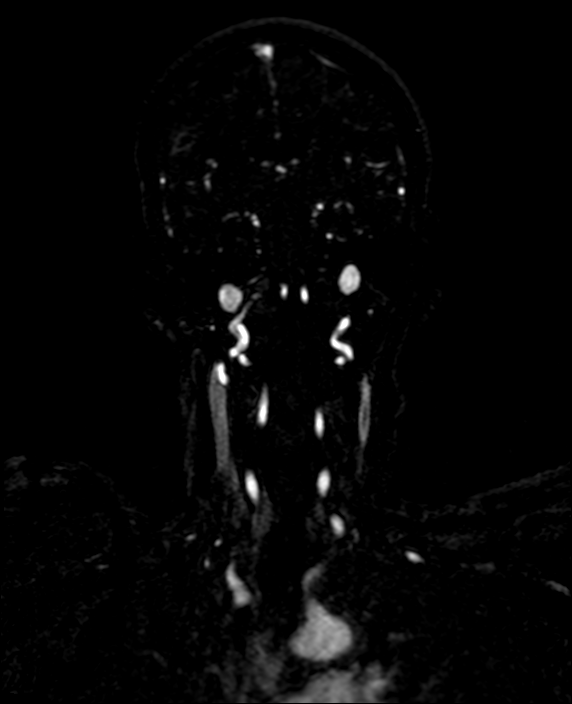
[im 99/112]
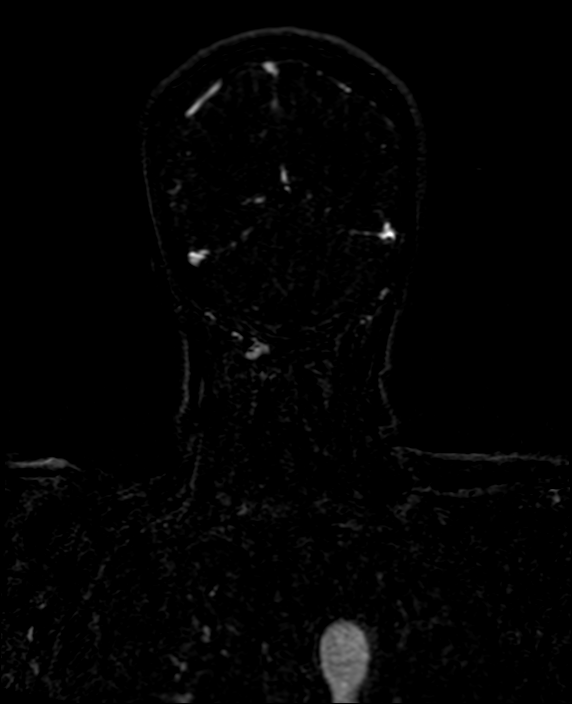

[13 of 48 positions shown; findings below may reference images not displayed]

EXAM

MRA cervical carotid/vertebral arteries

INDICATION

severe microvascular ischemic dz
INCREASING EPISODES OF TIA SYMPTOMS, CONFUSION, DIZZINESS, MEMORY LOSS, EXHAUSTION, WEAKNESS, 15 ML
[facility] RG

FINDINGS

There is no opacification of the left vertebral artery in the neck. The right vertebral artery is
opacified and patent throughout its course.
There is a segment of proximal left subclavian artery that is not opacified.

The common carotid arteries and carotid bulbs appear widely patent.

IMPRESSION

Findings suggest occlusion or stenosis of the proximal left subclavian artery with limited/no
anterograde flow in the left vertebral artery. CT angiography may give better detail of this area if
clinically indicated.

Tech Notes:

INCREASING EPISODES OF TIA SYMPTOMS, CONFUSION, DIZZINESS, MEMORY LOSS, EXHAUSTION, WEAKNESS, 15 ML
[facility] RG

## 2022-02-02 IMAGING — CR [ID]
3 series · 3 of 3 positions shown · non-contrast
Comparison: none

[w knee ap right]
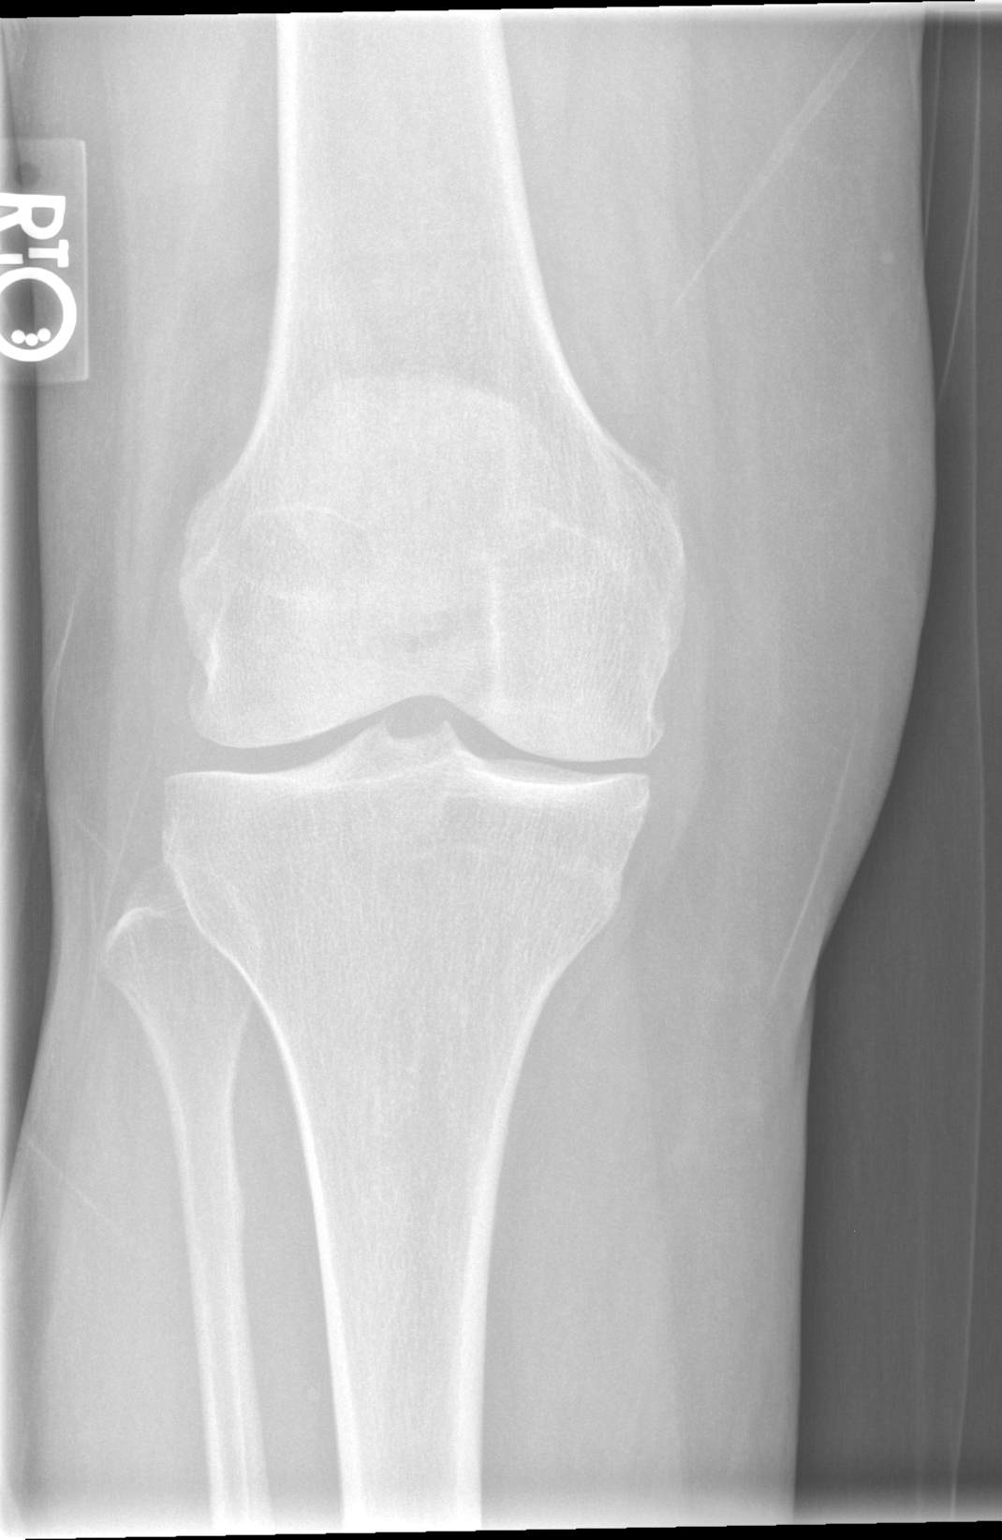

[w knee lat right]
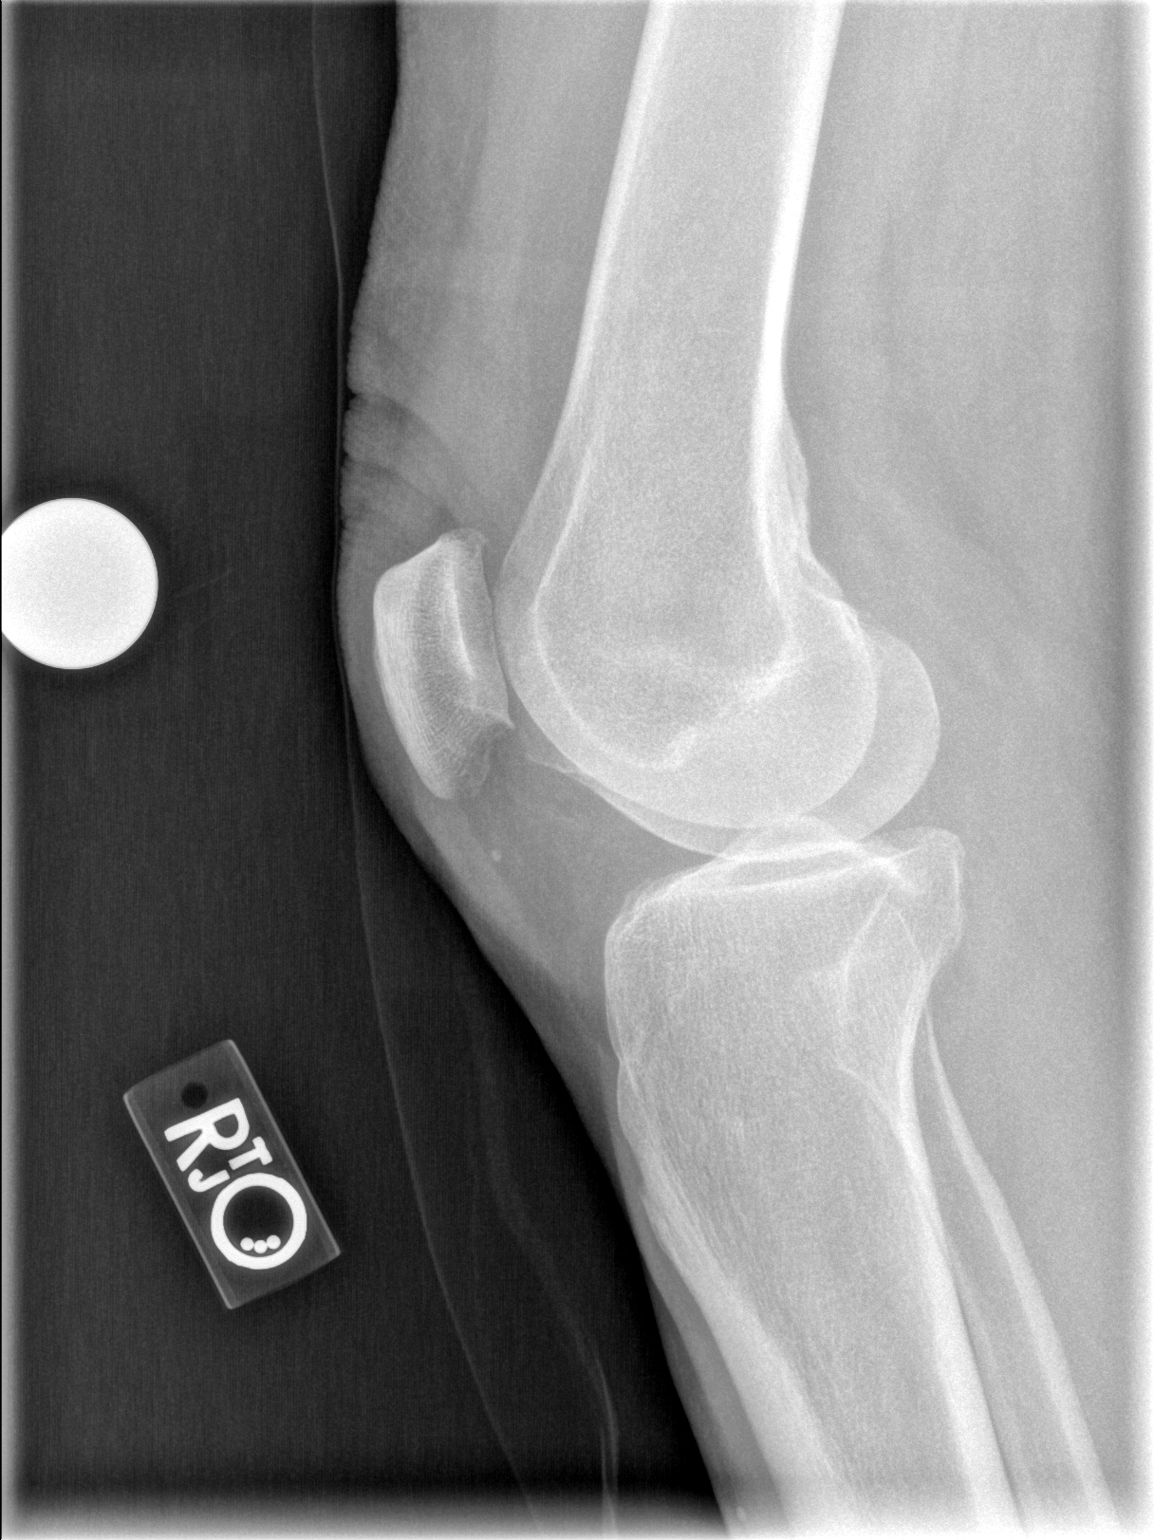

[x knee sunrise right]
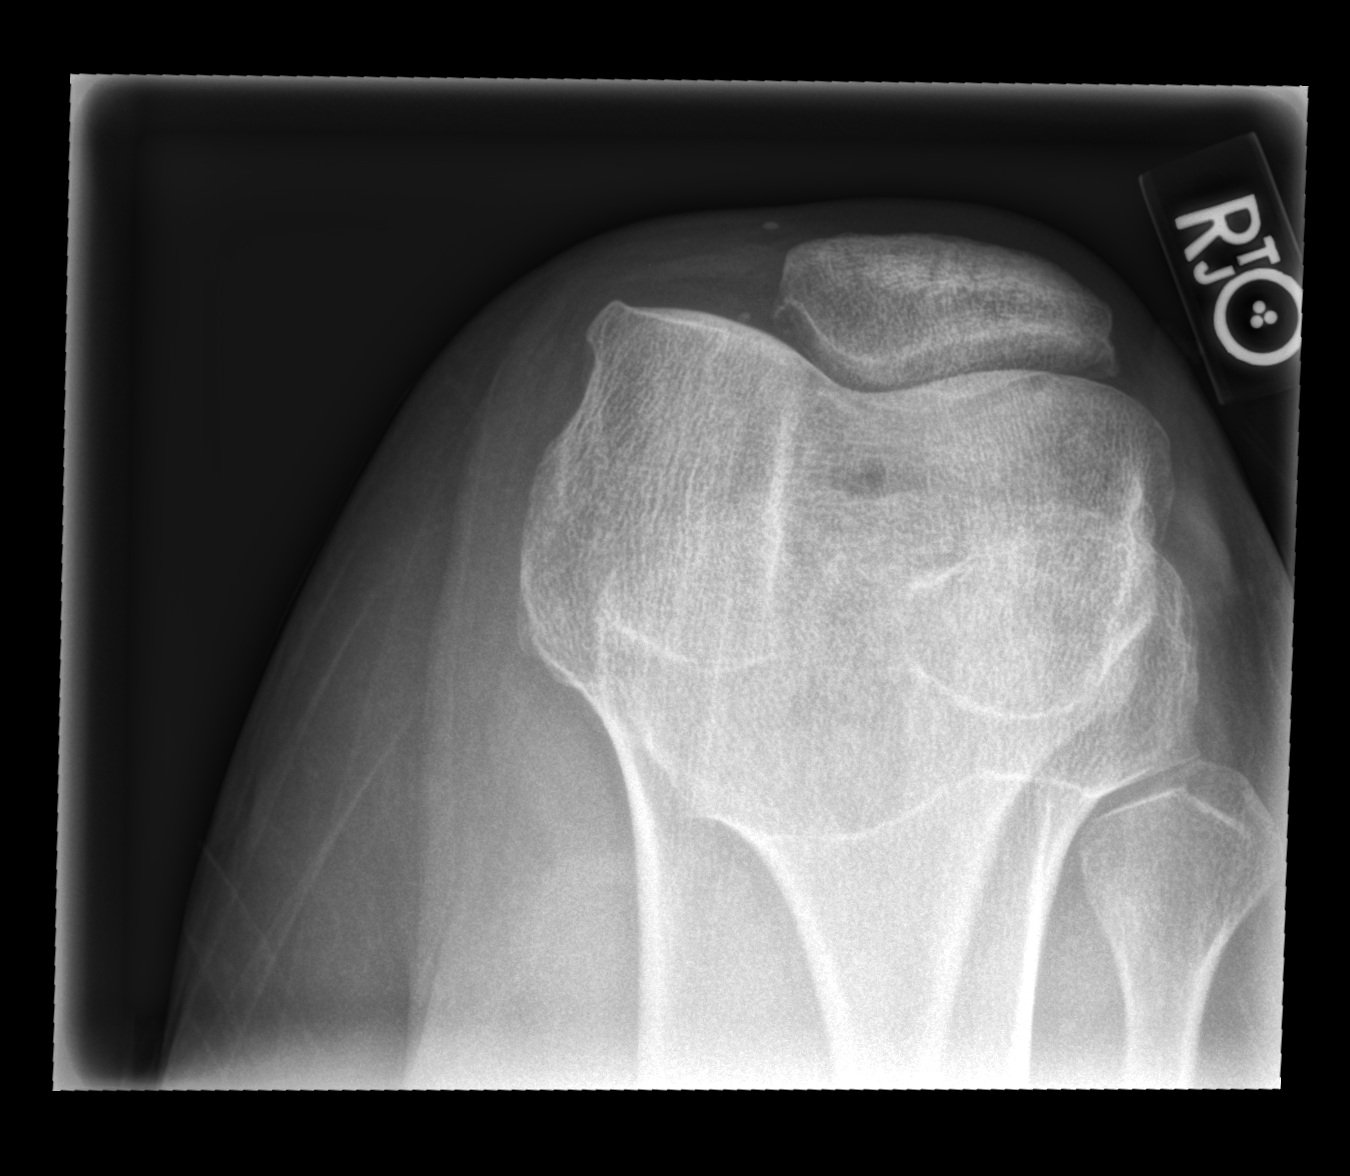

[3 of 3 positions shown; findings below may reference images not displayed]

EXAM

Right knee

INDICATION

R knee pain
RT KNEE PAIN, RECENT FALL ONTO KNEE

FINDINGS

Three views of the right knee were obtained.

There is mild narrowing of the medial aspect right femorotibial joint space. There is mild narrowing
of the patellofemoral joint space. There are small osteophytes of the medial femoral condyle, medial
tibial plateau and patella.

There is a small joint effusion.

IMPRESSION

There is mild to moderate osteoarthrosis of the right knee with no acute appearing abnormality.
There is a small joint effusion.

Tech Notes:

RT KNEE PAIN, RECENT FALL ONTO KNEE

## 2022-02-25 IMAGING — MR KNEERTWO
7 of 11 series · 24 of 40 positions shown · non-contrast
Comparison: none

[Series 8: T2 fat-sat · axial · right · 4.0mm · 0.40mm/px · z∈[-76,+33]mm · 3 of 26 slices shown (1 of 4)]
[im 1/26]
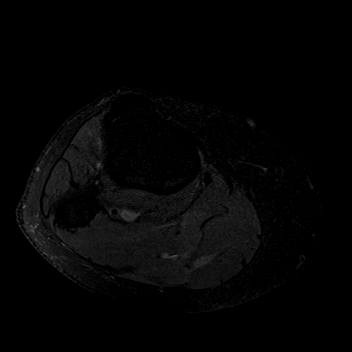
[im 13/26]
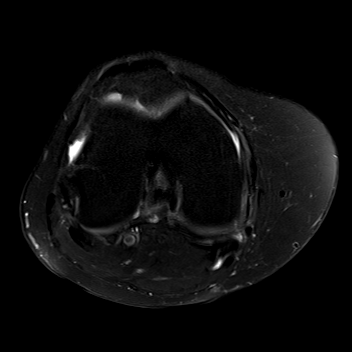
[im 26/26]
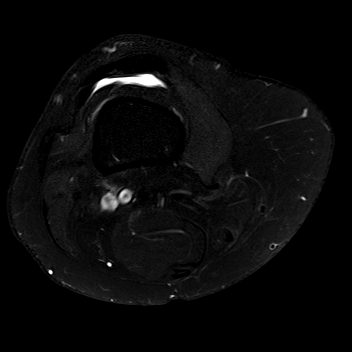

[Series 9: T1 · axial · right · 4.0mm · 0.44mm/px · z∈[-76,+33]mm · 3 of 26 slices shown (1 of 2)]
[im 1/26]
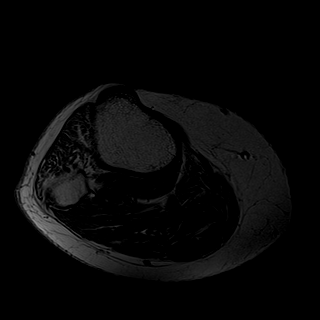
[im 13/26]
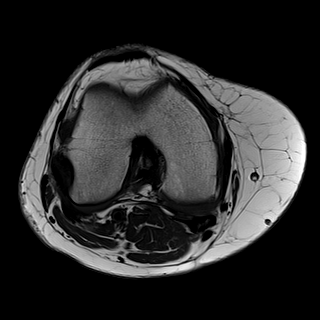
[im 26/26]
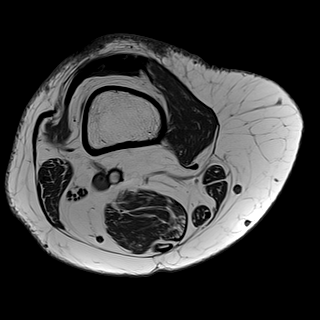

[Series 10: T2 fat-sat · coronal · right · 4.0mm · 0.46mm/px · 4 of 28 slices shown (2 of 4)]
[im 1/28]
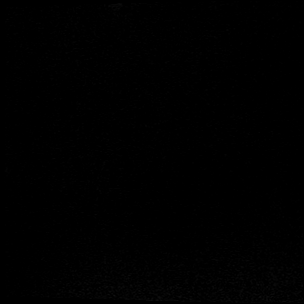
[im 10/28]
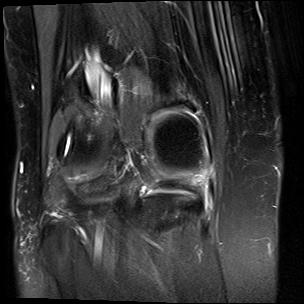
[im 19/28]
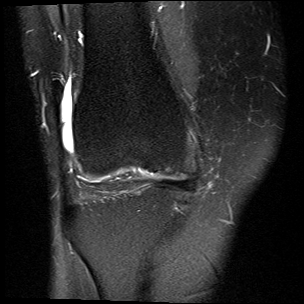
[im 28/28]
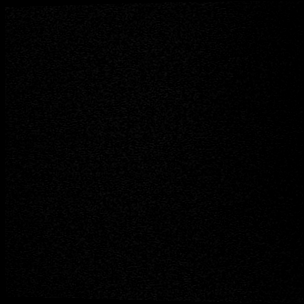

[Series 11: T2 fat-sat · sagittal · right · 4.0mm · 0.44mm/px · 4 of 28 slices shown (3 of 4)]
[im 1/28]
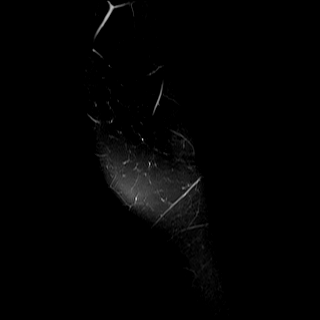
[im 10/28]
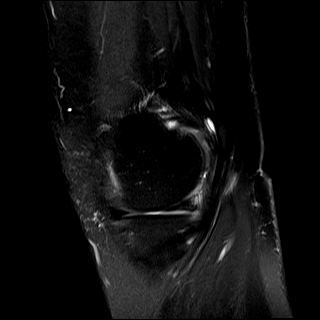
[im 19/28]
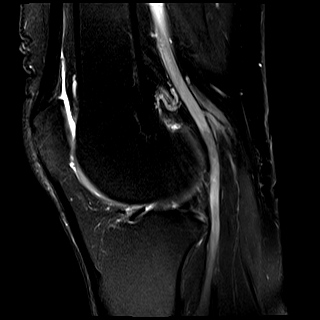
[im 28/28]
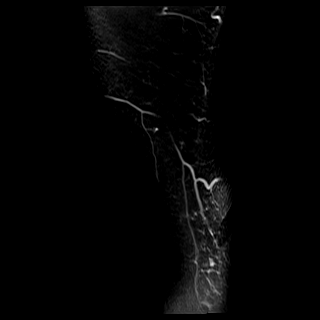

[Series 12: PD · sagittal · right · 4.0mm · 0.32mm/px · 4 of 28 slices shown]
[im 1/28]
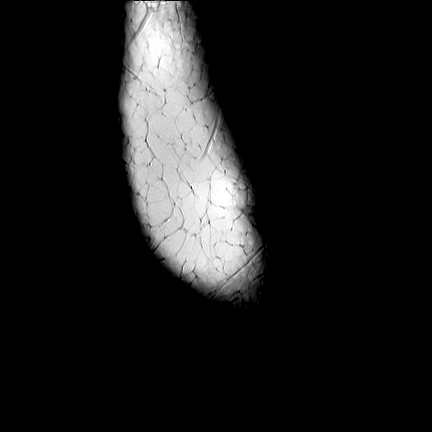
[im 10/28]
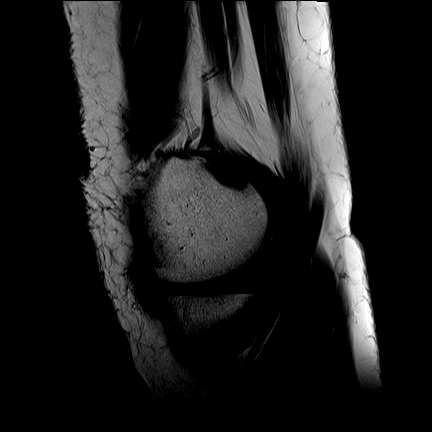
[im 19/28]
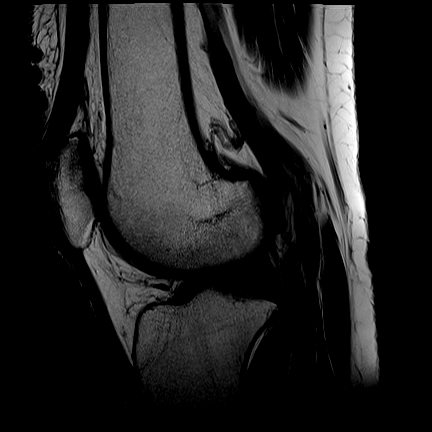
[im 28/28]
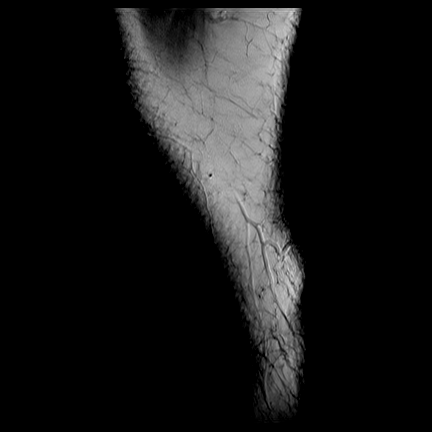

[Series 13: T1 · axial · right · 4.0mm · 0.44mm/px · z∈[-76,+33]mm · 3 of 26 slices shown (2 of 2)]
[im 1/26]
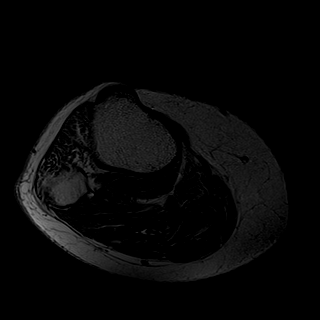
[im 13/26]
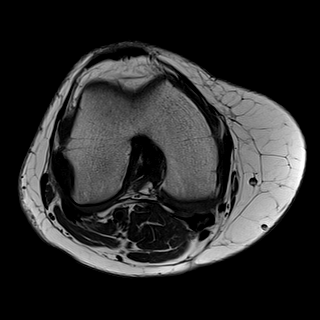
[im 26/26]
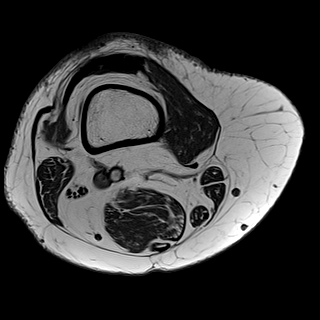

[Series 14: T2 fat-sat · axial · right · 4.0mm · 0.40mm/px · z∈[-76,+33]mm · 3 of 26 slices shown (4 of 4)]
[im 1/26]
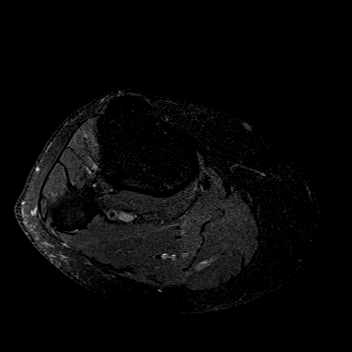
[im 13/26]
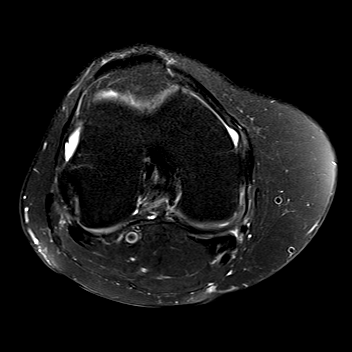
[im 26/26]
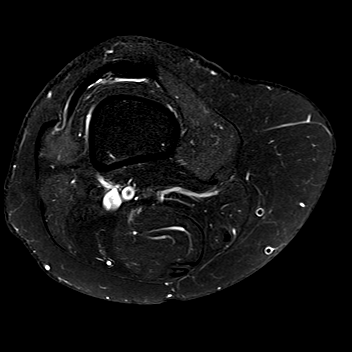

[24 of 40 positions shown; findings below may reference images not displayed]

DIAGNOSTIC STUDIES

EXAM

MR knee RT wo con

INDICATION

R knee pain
RT ANTERIOR AND MEDIAL KNEE PAIN AFTER SEVERAL RECENT FALLS, CHRONIC RT KNEE PAIN.  RG

TECHNIQUE

This sagittal axial and coronal images were obtained with variable T1 and T2 weighting.

COMPARISONS

None available

FINDINGS

Quadriceps and patellar tendons are within normal limits. There is mild loss of articular cartilage
of the patellofemoral joint space. Small knee effusion is noted.

The anterior and posterior cruciate ligaments are normal.

The medial lateral collateral ligaments are normal.

The lateral meniscus demonstrates T2 signal involving the free edge of the body on 1 image 14 series
10. This may represent degenerative change or tear.

There is medial extrusion of the body of the medial meniscus with associated marked loss of
articular cartilage. Small peripheral tear of the posterior horn of the medial meniscus is seen
image 11 series 11. There is also irregularity of the free edge of the posterior horn.

No abnormal marrow signal properties are seen.

IMPRESSION

Medial extrusion of the body of the medial meniscus and associated osteoarthritic changes. Small
medial meniscal tears are seen involving the peripheral aspect of the posterior horn and free edge
as discussed.

Knee effusion and mild loss of articular cartilage of the patellofemoral joint space.

Possible tear involving the free edge of the body of the lateral meniscus as discussed.

Tech Notes:

RT ANTERIOR AND MEDIAL KNEE PAIN AFTER SEVERAL RECENT FALLS, CHRONIC RT KNEE PAIN.  RG

## 2022-03-15 ENCOUNTER — Encounter: Admit: 2022-03-15 | Discharge: 2022-03-15 | Payer: Private Health Insurance - Indemnity

## 2022-10-03 ENCOUNTER — Encounter: Admit: 2022-10-03 | Discharge: 2022-10-03 | Payer: Private Health Insurance - Indemnity

## 2022-12-30 ENCOUNTER — Inpatient Hospital Stay: Payer: Private Health Insurance - Indemnity

## 2023-01-06 ENCOUNTER — Encounter: Admit: 2023-01-06 | Discharge: 2023-01-06 | Payer: Private Health Insurance - Indemnity

## 2023-01-23 ENCOUNTER — Encounter: Admit: 2023-01-23 | Discharge: 2023-01-23 | Payer: Private Health Insurance - Indemnity

## 2023-01-26 ENCOUNTER — Encounter: Admit: 2023-01-26 | Discharge: 2023-01-26 | Payer: Private Health Insurance - Indemnity

## 2023-01-26 NOTE — Telephone Encounter
Date and time of admission: 01/31/23 0800    Confirmation of admission for VEEG: Yes    Confirmation provided by: pt    Has there been any hospital admissions, emergency room visits, surgeries or illnesses in past 3 months? No    Has there been any issues with anxiety, depression or any issues  that may make it difficult to stay in hospital room for the duration of stay? No     Is patient aware that he/she will not be able to leave the room for the duration of Video EEG monitoring? Yes     Discussed with pt/guardian the following: Arrive at scheduled time to Admissions Office at [facility] located on the first floor. Come with clean hair, shampoo only, no use of conditioner, mousse, gel, no braids, extensions, corn rows or dread locks. Bring all medications in most recent original medication bottle that you receive from pharmacy. Take all medication as prescribed on day of admission including seizure medication: Yes    Discussed with pt/guardian that while in hospital they will be fall risk and must have nursing person assist when out of bed: Yes    Questions or concerns regarding admission for VEEG? Yes-questions answered    Provided information regarding [employer] [facility] and [facility]? Yes    Discussed admission process: Yes

## 2023-01-31 ENCOUNTER — Encounter: Admit: 2023-01-31 | Discharge: 2023-01-31 | Payer: Private Health Insurance - Indemnity

## 2023-01-31 DIAGNOSIS — R0600 Dyspnea, unspecified: Secondary | ICD-10-CM

## 2023-01-31 DIAGNOSIS — M797 Fibromyalgia: Secondary | ICD-10-CM

## 2023-01-31 DIAGNOSIS — I739 Peripheral vascular disease, unspecified: Secondary | ICD-10-CM

## 2023-01-31 DIAGNOSIS — T884XXA Failed or difficult intubation, initial encounter: Secondary | ICD-10-CM

## 2023-01-31 DIAGNOSIS — F32A Depression: Secondary | ICD-10-CM

## 2023-01-31 MED ADMIN — METOPROLOL SUCCINATE 25 MG PO TB24 [81866]: 25 mg | ORAL | @ 18:00:00 | NDC 00904632261

## 2023-01-31 MED ADMIN — BUPROPION XL 300 MG PO TB24 [88618]: 300 mg | ORAL | @ 18:00:00 | NDC 60687079311

## 2023-01-31 MED ADMIN — OXYBUTYNIN CHLORIDE 5 MG PO TR24 [81281]: 5 mg | ORAL | @ 18:00:00 | NDC 00904657061

## 2023-01-31 MED ADMIN — ACETAMINOPHEN 500 MG PO TAB [102]: 1000 mg | ORAL | @ 22:00:00 | NDC 00904673061

## 2023-02-01 MED ADMIN — HYDROXYZINE HCL 25 MG PO TAB [3774]: 25 mg | ORAL | @ 05:00:00 | Stop: 2023-02-01 | NDC 00904661761

## 2023-02-01 MED ADMIN — ENOXAPARIN 40 MG/0.4 ML SC SYRG [85052]: 40 mg | SUBCUTANEOUS | @ 01:00:00 | NDC 00781324602

## 2023-02-02 MED ADMIN — ACETAMINOPHEN 500 MG PO TAB [102]: 1000 mg | ORAL | @ 19:00:00 | NDC 00904673061

## 2023-02-02 MED ADMIN — LORATADINE 10 MG PO TAB [10466]: 10 mg | ORAL | @ 20:00:00 | NDC 00904685261

## 2023-02-02 MED ADMIN — OXYBUTYNIN CHLORIDE 5 MG PO TR24 [81281]: 5 mg | ORAL | @ 15:00:00 | NDC 00904657061

## 2023-02-02 MED ADMIN — METOPROLOL SUCCINATE 25 MG PO TB24 [81866]: 25 mg | ORAL | @ 15:00:00 | NDC 00904632261

## 2023-02-02 MED ADMIN — BENZONATATE 100 MG PO CAP [988]: 100 mg | ORAL | @ 19:00:00 | NDC 00904715361

## 2023-02-02 MED ADMIN — BUPROPION XL 300 MG PO TB24 [88618]: 300 mg | ORAL | @ 15:00:00 | NDC 60687079311

## 2023-02-02 MED ADMIN — FLUTICASONE FUROATE-VILANTEROL 200-25 MCG/DOSE IN DSDV [325370]: 1 | RESPIRATORY_TRACT | @ 13:00:00 | NDC 00173088214

## 2023-02-03 MED ADMIN — METOPROLOL SUCCINATE 25 MG PO TB24 [81866]: 25 mg | ORAL | @ 14:00:00 | NDC 00904632261

## 2023-02-03 MED ADMIN — BENZONATATE 100 MG PO CAP [988]: 100 mg | ORAL | @ 14:00:00 | NDC 00904715361

## 2023-02-03 MED ADMIN — HYDROXYZINE HCL 25 MG PO TAB [3774]: 25 mg | ORAL | @ 02:00:00 | Stop: 2023-02-03 | NDC 00904661761

## 2023-02-03 MED ADMIN — BUPROPION XL 300 MG PO TB24 [88618]: 300 mg | ORAL | @ 14:00:00 | NDC 60687079311

## 2023-02-03 MED ADMIN — ACETAMINOPHEN 500 MG PO TAB [102]: 1000 mg | ORAL | @ 14:00:00 | NDC 00904673061

## 2023-02-03 MED ADMIN — ENOXAPARIN 40 MG/0.4 ML SC SYRG [85052]: 40 mg | SUBCUTANEOUS | @ 02:00:00 | NDC 00781324602

## 2023-02-03 MED ADMIN — ACETAMINOPHEN 500 MG PO TAB [102]: 1000 mg | ORAL | @ 23:00:00 | NDC 00904673061

## 2023-02-03 MED ADMIN — OXYBUTYNIN CHLORIDE 5 MG PO TR24 [81281]: 5 mg | ORAL | @ 14:00:00 | NDC 00904657061

## 2023-02-03 MED ADMIN — BENZONATATE 100 MG PO CAP [988]: 100 mg | ORAL | @ 19:00:00 | NDC 00904715361

## 2023-02-03 MED ADMIN — ALBUTEROL SULFATE 90 MCG/ACTUATION IN HFAA [40196]: 2 | RESPIRATORY_TRACT | @ 15:00:00 | Stop: 2023-02-03 | NDC 68180096301

## 2023-02-03 MED ADMIN — PANTOPRAZOLE 40 MG PO TBEC [80436]: 40 mg | ORAL | @ 02:00:00 | NDC 00904647461

## 2023-02-03 MED ADMIN — LORATADINE 10 MG PO TAB [10466]: 10 mg | ORAL | @ 14:00:00 | NDC 00904685261

## 2023-02-04 MED ADMIN — BUPROPION XL 300 MG PO TB24 [88618]: 300 mg | ORAL | @ 15:00:00 | NDC 60687079311

## 2023-02-04 MED ADMIN — ENOXAPARIN 40 MG/0.4 ML SC SYRG [85052]: 40 mg | SUBCUTANEOUS | @ 02:00:00 | NDC 00781324602

## 2023-02-04 MED ADMIN — ACETAMINOPHEN 500 MG PO TAB [102]: 1000 mg | ORAL | @ 15:00:00 | NDC 00904673061

## 2023-02-04 MED ADMIN — HYDROXYZINE HCL 25 MG PO TAB [3774]: 25 mg | ORAL | @ 03:00:00 | Stop: 2023-02-04 | NDC 00904661761

## 2023-02-04 MED ADMIN — PANTOPRAZOLE 40 MG PO TBEC [80436]: 40 mg | ORAL | @ 02:00:00 | NDC 00904647461

## 2023-02-04 MED ADMIN — METOPROLOL SUCCINATE 25 MG PO TB24 [81866]: 12.5 mg | ORAL | @ 15:00:00 | Stop: 2023-02-04 | NDC 00904632261

## 2023-02-04 MED ADMIN — BENZONATATE 100 MG PO CAP [988]: 100 mg | ORAL | @ 20:00:00 | NDC 00904715361

## 2023-02-04 MED ADMIN — BENZONATATE 100 MG PO CAP [988]: 100 mg | ORAL | @ 15:00:00 | NDC 00904715361

## 2023-02-04 MED ADMIN — BENZONATATE 100 MG PO CAP [988]: 100 mg | ORAL | @ 03:00:00 | NDC 00904715361

## 2023-02-04 MED ADMIN — OXYBUTYNIN CHLORIDE 5 MG PO TR24 [81281]: 5 mg | ORAL | @ 15:00:00 | NDC 00904657061

## 2023-02-04 MED ADMIN — EUCALYPTUS-MENTHOL MM LOZG [83613]: 1 | ORAL | @ 22:00:00 | NDC 12546062970

## 2023-02-04 MED ADMIN — LORATADINE 10 MG PO TAB [10466]: 10 mg | ORAL | @ 15:00:00 | NDC 00904685261

## 2023-02-05 MED ADMIN — OXYBUTYNIN CHLORIDE 5 MG PO TR24 [81281]: 5 mg | ORAL | @ 13:00:00 | NDC 00904657061

## 2023-02-05 MED ADMIN — PANTOPRAZOLE 40 MG PO TBEC [80436]: 40 mg | ORAL | @ 02:00:00 | NDC 00904647461

## 2023-02-05 MED ADMIN — ACETAMINOPHEN 500 MG PO TAB [102]: 1000 mg | ORAL | @ 15:00:00 | NDC 00904673061

## 2023-02-05 MED ADMIN — METOPROLOL SUCCINATE 25 MG PO TB24 [81866]: 37.5 mg | ORAL | @ 13:00:00 | NDC 00904632261

## 2023-02-05 MED ADMIN — BENZONATATE 100 MG PO CAP [988]: 100 mg | ORAL | @ 17:00:00 | NDC 00904715361

## 2023-02-05 MED ADMIN — LORATADINE 10 MG PO TAB [10466]: 10 mg | ORAL | @ 13:00:00 | NDC 00904685261

## 2023-02-05 MED ADMIN — BUPROPION XL 300 MG PO TB24 [88618]: 300 mg | ORAL | @ 13:00:00 | NDC 60687079311

## 2023-02-05 MED ADMIN — ENOXAPARIN 40 MG/0.4 ML SC SYRG [85052]: 40 mg | SUBCUTANEOUS | @ 02:00:00 | NDC 00781324602

## 2023-02-06 ENCOUNTER — Encounter: Admit: 2023-02-06 | Discharge: 2023-02-06 | Payer: Private Health Insurance - Indemnity

## 2023-02-06 MED ADMIN — ACETAMINOPHEN 500 MG PO TAB [102]: 1000 mg | ORAL | @ 02:00:00 | NDC 00904673061

## 2023-02-06 MED ADMIN — PANTOPRAZOLE 40 MG PO TBEC [80436]: 40 mg | ORAL | @ 02:00:00 | NDC 00904647461

## 2023-02-06 MED ADMIN — LORATADINE 10 MG PO TAB [10466]: 10 mg | ORAL | @ 14:00:00 | Stop: 2023-02-06 | NDC 00904685261

## 2023-02-06 MED ADMIN — ENOXAPARIN 40 MG/0.4 ML SC SYRG [85052]: 40 mg | SUBCUTANEOUS | @ 02:00:00 | NDC 00781324602

## 2023-02-06 MED ADMIN — BENZONATATE 100 MG PO CAP [988]: 100 mg | ORAL | @ 14:00:00 | Stop: 2023-02-06 | NDC 00904715361

## 2023-02-06 MED ADMIN — OXYBUTYNIN CHLORIDE 5 MG PO TR24 [81281]: 5 mg | ORAL | @ 14:00:00 | Stop: 2023-02-06 | NDC 00904657061

## 2023-02-06 MED ADMIN — METOPROLOL SUCCINATE 25 MG PO TB24 [81866]: 37.5 mg | ORAL | @ 14:00:00 | Stop: 2023-02-06 | NDC 00904632261

## 2023-02-06 MED ADMIN — ALBUTEROL SULFATE 90 MCG/ACTUATION IN HFAA [40196]: 2 | RESPIRATORY_TRACT | @ 14:00:00 | Stop: 2023-02-06 | NDC 68180096301

## 2023-02-06 MED ADMIN — MELATONIN 3 MG PO TAB [16830]: 3 mg | ORAL | @ 03:00:00 | NDC 20555003600

## 2023-02-06 MED ADMIN — ACETAMINOPHEN 500 MG PO TAB [102]: 1000 mg | ORAL | @ 14:00:00 | Stop: 2023-02-06 | NDC 00904673061

## 2023-02-06 MED ADMIN — HYDROXYZINE HCL 25 MG PO TAB [3774]: 25 mg | ORAL | @ 03:00:00 | Stop: 2023-02-06 | NDC 00904661761

## 2023-02-06 MED ADMIN — BENZONATATE 100 MG PO CAP [988]: 100 mg | ORAL | @ 02:00:00 | NDC 00904715361

## 2023-02-06 MED ADMIN — BUPROPION XL 300 MG PO TB24 [88618]: 300 mg | ORAL | @ 14:00:00 | Stop: 2023-02-06 | NDC 60687079311

## 2023-04-07 ENCOUNTER — Encounter: Admit: 2023-04-07 | Discharge: 2023-04-07 | Payer: Private Health Insurance - Indemnity
